# Patient Record
Sex: Female | Born: 1955 | Race: Black or African American | Hispanic: No | Marital: Single | Smoking: Former smoker
Health system: Southern US, Community
[De-identification: ages and names within clinical notes are randomized; demographics above are authoritative.]

## PROBLEM LIST (undated history)

## (undated) DIAGNOSIS — I10 Essential (primary) hypertension: Secondary | ICD-10-CM

## (undated) DIAGNOSIS — R7303 Prediabetes: Secondary | ICD-10-CM

## (undated) DIAGNOSIS — K219 Gastro-esophageal reflux disease without esophagitis: Secondary | ICD-10-CM

## (undated) DIAGNOSIS — Z8719 Personal history of other diseases of the digestive system: Secondary | ICD-10-CM

## (undated) DIAGNOSIS — D649 Anemia, unspecified: Secondary | ICD-10-CM

## (undated) DIAGNOSIS — R011 Cardiac murmur, unspecified: Secondary | ICD-10-CM

## (undated) HISTORY — PX: APPENDECTOMY: SHX54

## (undated) HISTORY — PX: ESOPHAGOGASTRODUODENOSCOPY: SHX1529

## (undated) HISTORY — PX: COLONOSCOPY: SHX174

---

## 2008-09-01 ENCOUNTER — Ambulatory Visit: Payer: Self-pay | Admitting: Family Medicine

## 2008-10-06 ENCOUNTER — Ambulatory Visit: Payer: Self-pay | Admitting: Gastroenterology

## 2009-05-18 ENCOUNTER — Ambulatory Visit: Payer: Self-pay | Admitting: Family Medicine

## 2014-10-08 ENCOUNTER — Encounter: Payer: Self-pay | Admitting: Medical Oncology

## 2014-10-08 ENCOUNTER — Emergency Department
Admission: EM | Admit: 2014-10-08 | Discharge: 2014-10-08 | Disposition: A | Discharge status: Home / Self Care | Payer: Self-pay | Attending: Emergency Medicine | Admitting: Emergency Medicine

## 2014-10-08 DIAGNOSIS — I10 Essential (primary) hypertension: Secondary | ICD-10-CM | POA: Insufficient documentation

## 2014-10-08 DIAGNOSIS — Y998 Other external cause status: Secondary | ICD-10-CM | POA: Insufficient documentation

## 2014-10-08 DIAGNOSIS — Y9389 Activity, other specified: Secondary | ICD-10-CM | POA: Insufficient documentation

## 2014-10-08 DIAGNOSIS — Y9289 Other specified places as the place of occurrence of the external cause: Secondary | ICD-10-CM | POA: Insufficient documentation

## 2014-10-08 DIAGNOSIS — S60561A Insect bite (nonvenomous) of right hand, initial encounter: Secondary | ICD-10-CM | POA: Insufficient documentation

## 2014-10-08 DIAGNOSIS — Z79899 Other long term (current) drug therapy: Secondary | ICD-10-CM | POA: Insufficient documentation

## 2014-10-08 DIAGNOSIS — Z760 Encounter for issue of repeat prescription: Secondary | ICD-10-CM | POA: Insufficient documentation

## 2014-10-08 DIAGNOSIS — W57XXXA Bitten or stung by nonvenomous insect and other nonvenomous arthropods, initial encounter: Secondary | ICD-10-CM | POA: Insufficient documentation

## 2014-10-08 HISTORY — DX: Essential (primary) hypertension: I10

## 2014-10-08 HISTORY — DX: Gastro-esophageal reflux disease without esophagitis: K21.9

## 2014-10-08 MED ORDER — CEPHALEXIN 500 MG PO CAPS: 500.0000 mg | ORAL_CAPSULE | Freq: Four times a day (QID) | ORAL | Status: AC

## 2014-10-08 MED ORDER — HYDROXYZINE PAMOATE 25 MG PO CAPS: 25.0000 mg | ORAL_CAPSULE | Freq: Three times a day (TID) | ORAL | Status: DC | PRN

## 2014-10-08 MED ORDER — LISINOPRIL 40 MG PO TABS: 40.0000 mg | ORAL_TABLET | Freq: Every day | ORAL | Status: DC

## 2014-10-08 NOTE — ED Notes (Signed)
Pt ambulatory to triage with possible insect bite to rt hand. Area swollen and itchy.

## 2014-10-08 NOTE — Discharge Instructions (Signed)
Insect Bite Mosquitoes, flies, fleas, bedbugs, and many other insects can bite. Insect bites are different from insect stings. A sting is when venom is injected into the skin. Some insect bites can transmit infectious diseases. SYMPTOMS  Insect bites usually turn red, swell, and itch for 2 to 4 days. They often go away on their own. TREATMENT  Your caregiver may prescribe antibiotic medicines if a bacterial infection develops in the bite. HOME CARE INSTRUCTIONS  Do not scratch the bite area.  Keep the bite area clean and dry. Wash the bite area thoroughly with soap and water.  Put ice or cool compresses on the bite area.  Put ice in a plastic bag.  Place a towel between your skin and the bag.  Leave the ice on for 20 minutes, 4 times a day for the first 2 to 3 days, or as directed.  You may apply a baking soda paste, cortisone cream, or calamine lotion to the bite area as directed by your caregiver. This can help reduce itching and swelling.  Only take over-the-counter or prescription medicines as directed by your caregiver.  In 2 days, if no improvement with Vistaril and hydrocortisone cream and start antibiotic You may need a tetanus shot if:  You cannot remember when you had your last tetanus shot.  You have never had a tetanus shot.  The injury broke your skin. If you get a tetanus shot, your arm may swell, get red, and feel warm to the touch. This is common and not a problem. If you need a tetanus shot and you choose not to have one, there is a rare chance of getting tetanus. Sickness from tetanus can be serious. SEEK IMMEDIATE MEDICAL CARE IF:   You have increased pain, redness, or swelling in the bite area.  You see a red line on the skin coming from the bite.  You have a fever.  You have joint pain.  You have a headache or neck pain.  You have unusual weakness.  You have a rash.  You have chest pain or shortness of breath.  You have abdominal pain, nausea,  or vomiting.  You feel unusually tired or sleepy. MAKE SURE YOU:   Understand these instructions.  Will watch your condition.  Will get help right away if you are not doing well or get worse. Document Released: 03/01/2004 Document Revised: 04/16/2011 Document Reviewed: 08/23/2010 [facility] Patient Information 2015 [place], [place]. This information is not intended to replace advice given to you by your health care provider. Make sure you discuss any questions you have with your health care provider.  Hypertension Hypertension is another name for high blood pressure. High blood pressure forces your heart to work harder to pump blood. A blood pressure reading has two numbers, which includes a higher number over a lower number (example: 110/72). HOME CARE   Have your blood pressure rechecked by your doctor.  Only take medicine as told by your doctor. Follow the directions carefully. The medicine does not work as well if you skip doses. Skipping doses also puts you at risk for problems.  Do not smoke.  Monitor your blood pressure at home as told by your doctor. GET HELP IF:  You think you are having a reaction to the medicine you are taking.  You have repeat headaches or feel dizzy.  You have puffiness (swelling) in your ankles.  You have trouble with your vision. GET HELP RIGHT AWAY IF:   You get a very bad headache  and are confused.  You feel weak, numb, or faint.  You get chest or belly (abdominal) pain.  You throw up (vomit).  You cannot breathe very well. MAKE SURE YOU:   Understand these instructions.  Will watch your condition.  Will get help right away if you are not doing well or get worse. Document Released: 07/11/2007 Document Revised: 01/27/2013 Document Reviewed: 11/14/2012 [facility] Patient Information 2015 [place], [place]. This information is not intended to replace advice given to you by your health care provider. Make sure you discuss any questions you  have with your health care provider.

## 2014-10-08 NOTE — ED Provider Notes (Signed)
CSN: [id number]     Arrival date & time 10/08/14  1541 History   First MD Initiated Contact with Patient 10/08/14 1617     Chief Complaint  Patient presents with  . Insect Bite     (Consider location/radiation/quality/duration/timing/severity/associated sxs/prior Treatment) HPI  59 year old female presents today for evaluation of erythema and pruritus to the right hand. Symptoms have been present for 1-2 days. She denies any trauma or injury. She is unsure what has caused the inflammation. She believes it may have been an insect bite. Patient began scratching the dorsum of the right hand, she has had increased swelling. No drainage or pain. There is mild warmth.  Patient has history of hypertension, she took her last tablet 40 mg of lisinopril this morning. Patient needs a refill until she can see her new PCP she denies any chest pain shortness of breath headaches or vision changes.  Past Medical History  Diagnosis Date  . Hypertension   . GERD (gastroesophageal reflux disease)    Past Surgical History  Procedure Laterality Date  . Appendectomy     No family history on file. Social History  Substance Use Topics  . Smoking status: Never Smoker   . Smokeless tobacco: None  . Alcohol Use: Yes   OB History    No data available     Review of Systems  Constitutional: Negative for fever, chills, activity change and fatigue.  HENT: Negative for congestion, sinus pressure and sore throat.   Eyes: Negative for visual disturbance.  Respiratory: Negative for cough, chest tightness and shortness of breath.   Cardiovascular: Negative for chest pain and leg swelling.  Gastrointestinal: Negative for nausea, vomiting, abdominal pain and diarrhea.  Genitourinary: Negative for dysuria.  Musculoskeletal: Negative for arthralgias and gait problem.  Skin: Positive for rash.  Neurological: Negative for weakness, numbness and headaches.  Hematological: Negative for adenopathy.   Psychiatric/Behavioral: Negative for behavioral problems, confusion and agitation.      Allergies  Review of patient's allergies indicates no known allergies.  Home Medications   Prior to Admission medications   Medication Sig Start Date End Date Taking? Authorizing Provider  cephALEXin (KEFLEX) 500 MG capsule Take 1 capsule (500 mg total) by mouth 4 (four) times daily. 10/08/14 10/18/14  Duanne Guess, PA-C  hydrOXYzine (VISTARIL) 25 MG capsule Take 1 capsule (25 mg total) by mouth 3 (three) times daily as needed for itching. 10/08/14   Duanne Guess, PA-C  lisinopril (PRINIVIL,ZESTRIL) 40 MG tablet Take 40 mg by mouth daily.    Historical Provider, MD  lisinopril (PRINIVIL,ZESTRIL) 40 MG tablet Take 1 tablet (40 mg total) by mouth daily. 10/08/14   Duanne Guess, PA-C   BP 180/98 mmHg  Pulse 60  Temp(Src) 98.4 F (36.9 C) (Oral)  Resp 18  Ht 5\' 2"  (1.575 m)  Wt 168 lb (76.204 kg)  BMI 30.72 kg/m2  SpO2 100% Physical Exam  Constitutional: She is oriented to person, place, and time. She appears well-developed and well-nourished. No distress.  HENT:  Head: Normocephalic and atraumatic.  Mouth/Throat: Oropharynx is clear and moist.  Eyes: EOM are normal. Pupils are equal, round, and reactive to light. Right eye exhibits no discharge. Left eye exhibits no discharge.  Neck: Normal range of motion. Neck supple.  Cardiovascular: Normal rate, regular rhythm and intact distal pulses.   Pulmonary/Chest: Effort normal and breath sounds normal. No respiratory distress. She exhibits no tenderness.  Abdominal: Soft. She exhibits no distension. There is no tenderness.  Musculoskeletal: Normal range of motion. She exhibits no edema.  Neurological: She is alert and oriented to person, place, and time. She has normal reflexes.  Skin: Skin is warm and dry.  Dorsum of the right hand has 2 small papules with surrounding erythema. There is minimal warmth and minimal swelling. No tenderness to  palpation. Patient is able to make a full fist. She is neuro rest intact in right upper extremity. No fluctuance or induration.  Psychiatric: She has a normal mood and affect. Her behavior is normal. Thought content normal.  Nursing note and vitals reviewed.   ED Course  Procedures (including critical care time) Labs Review Labs Reviewed - No data to display  Imaging Review No results found. I have personally reviewed and evaluated these images and lab results as part of my medical decision-making.   EKG Interpretation None      MDM   Final diagnoses:  Insect bite of right hand, initial encounter  Essential hypertension  Medication refill    59 year old female with right hand insect bite, pruritus, inflammation. She is started on hydrocortisone cream, Vistaril. In 2 days if no improvement or for any worsening erythema or swelling, would recommend starting Keflex. Patient was given a prescription for Keflex and directions on when to take. Patient was also given a refill of her lisinopril. She took her last pill this morning and has a follow-up with a new PCP. Turn to the ER for any worsening symptoms urgent changes    Duanne Guess, PA-C 10/08/14 1636  Earleen Newport, MD 10/08/14 2138

## 2015-08-24 ENCOUNTER — Encounter: Payer: Self-pay | Admitting: *Deleted

## 2015-08-24 ENCOUNTER — Ambulatory Visit: Payer: Self-pay | Attending: Internal Medicine | Admitting: *Deleted

## 2015-08-24 ENCOUNTER — Ambulatory Visit
Admission: RE | Admit: 2015-08-24 | Discharge: 2015-08-24 | Disposition: A | Discharge status: Home / Self Care | Payer: Self-pay | Source: Ambulatory Visit | Attending: Internal Medicine | Admitting: Internal Medicine

## 2015-08-24 VITALS — BP 163/94 | HR 71 | Temp 97.1°F | Resp 16 | Ht 64.57 in | Wt 163.8 lb

## 2015-08-24 DIAGNOSIS — Z0000 Encounter for general adult medical examination without abnormal findings: Secondary | ICD-10-CM

## 2015-08-24 NOTE — Progress Notes (Signed)
Subjective:     Patient ID: Cassie Gaines, female   DOB: [birth year], 60 y.o.   MRN: [id number]  HPI   Review of Systems     Objective:   Physical Exam  Pulmonary/Chest: Right breast exhibits no inverted nipple, no mass, no nipple discharge, no skin change and no tenderness. Left breast exhibits no inverted nipple, no mass, no nipple discharge, no skin change and no tenderness. Breasts are symmetrical.  Abdominal: There is no splenomegaly or hepatomegaly.  Genitourinary: No breast swelling, tenderness, discharge or bleeding. No labial fusion. There is no rash, tenderness, lesion or injury on the right labia. There is no rash, tenderness, lesion or injury on the left labia. Cervix exhibits no motion tenderness, no discharge and no friability. Right adnexum displays no mass, no tenderness and no fullness. Left adnexum displays no mass, no tenderness and no fullness. No erythema, tenderness or bleeding in the vagina. No foreign body around the vagina. No signs of injury around the vagina. Vaginal discharge found.  White non odorous discharge noted.  Very full heavy cervix noted       Assessment:     60 year old Black female presents to [facility] for clinical breast exam, pap and mammogram.  Clinical breast exam unremarkable.  Taught self breast awareness.  Specimen collected for pap smear without difficulty.  Very full heavy cervix noted.  Patient states she was told she had fibroids.  Patient has been screened for eligibility.  She does not have any insurance, Medicare or Medicaid.  She also meets financial eligibility.  Hand-out given on the Affordable Care Act.    Plan:     Screening mammogram ordered.  Specimen sent to the lab.  Will send patient to [facility] for further evaluation of the full heavy cervix.  [name] to call and schedule the appointment for her.  Will follow up per [facility] protocol.

## 2015-08-24 NOTE — Patient Instructions (Signed)
Human Papillomavirus Human papillomavirus (HPV) is the most common sexually transmitted infection (STI) and is highly contagious. HPV infections cause genital warts and cancers to the outlet of the womb (cervix), birth canal (vagina), opening of the birth canal (vulva), and anus. There are over 100 types of HPV. Unless wartlike lesions are present in the throat or there are genital warts that you can see or feel, HPV usually does not cause symptoms. It is possible to be infected for long periods and pass it on to others without knowing it. CAUSES  HPV is spread from person to person through sexual contact. This includes oral, vaginal, or anal sex. RISK FACTORS  Having unprotected sex. HPV can be spread by oral, vaginal, or anal sex.  Having several sex partners.  Having a sex partner who has other sex partners.  Having or having had another sexually transmitted infection. SIGNS AND SYMPTOMS  Most people carrying HPV do not have any symptoms. If symptoms are present, symptoms may include:  Wartlike lesions in the throat (from having oral sex).  Warts in the infected skin or mucous membranes.  Genital warts that may itch, burn, or bleed.  Genital warts that may be painful or bleed during sexual intercourse. DIAGNOSIS  If wartlike lesions are present in the throat or genital warts are present, your health care provider can usually diagnose HPV by physical examination.   Genital warts are easily seen with the naked eye.  Currently, there is no FDA-approved test to detect HPV in males.  In females, a Pap test can show cells that are infected with HPV.  In females, a scope can be used to view the cervix (colposcopy). A colposcopy can be performed if the pelvic exam or Pap test is abnormal. A sample of tissue may be removed (biopsy) during the colposcopy. TREATMENT  There is no treatment for the virus itself. However, there are treatments for the health problems and symptoms HPV can cause.  Your health care provider will follow you closely after you are treated. This is because the HPV can come back and may need treatment again. Treatment of HPV may include:   Medicines, which may be injected or applied in a cream, lotion, or gel form.  Use of a probe to apply extreme cold (cryotherapy).  Application of an intense beam of light (laser treatment).  Use of a probe to apply extreme heat (electrocautery).  Surgery. HOME CARE INSTRUCTIONS   Take medicines only as directed by your health care provider.  Use over-the-counter creams for itching or irritation as directed by your health care provider.  Keep all follow-up visits as directed by your health care provider. This is important.  Do not touch or scratch the warts.  Do not treat genital warts with medicines used for treating hand warts.  Do not have sex while you are being treated.  Do not douche or use tampons during treatment of HPV.  Tell your sex partner about your infection because he or she may also need treatment.  If you become pregnant, tell your health care provider that you have had HPV. Your health care provider will monitor you closely during pregnancy to be sure your baby is safe.  After treatment, use condoms during sex to prevent future infections.  Have only one sex partner.  Have a sex partner who does not have other sex partners. PREVENTION   Talk to your health care provider about getting the HPV vaccines. These vaccines prevent some HPV infections and cancers.  It is recommended that the vaccine be given to males and females between the ages of 2 and 72 years old. It will not work if you already have HPV, and it is not recommended for pregnant women.  A Pap test is done to screen for cervical cancer in women.  The first Pap test should be done at age 69 years.  Between ages 62 and 16 years, Pap tests are repeated every 2 years.  Beginning at age 41, you are advised to have a Pap test every  3 years as long as your past 3 Pap tests have been normal.  Some women have medical problems that increase the chance of getting cervical cancer. Talk to your health care provider about these problems. It is especially important to talk to your health care provider if a new problem develops soon after your last Pap test. In these cases, your health care provider may recommend more frequent screening and Pap tests.  The above recommendations are the same for women who have or have not gotten the vaccine for HPV.  If you had a hysterectomy for a problem that was not a cancer or a condition that could lead to cancer, then you no longer need Pap tests. However, even if you no longer need a Pap test, a regular exam is a good idea to make sure no other problems are starting.   If you are between the ages of 60 and 39 years and you have had normal Pap tests going back 10 years, you no longer need Pap tests. However, even if you no longer need a Pap test, a regular exam is a good idea to make sure no other problems are starting.  If you have had past treatment for cervical cancer or a condition that could lead to cancer, you need Pap tests and screening for cancer for at least 20 years after your treatment.  If Pap tests have been discontinued, risk factors (such as a new sexual partner)need to be reassessed to determine if screening should be resumed.  Some women may need screenings more often if they are at high risk for cervical cancer. SEEK MEDICAL CARE IF:   The treated skin becomes red, swollen, or painful.  You have a fever.  You feel generally ill.  You feel lumps or pimple-like projections in and around your genital area.  You develop bleeding of the vagina or the treatment area.  You have painful sexual intercourse. MAKE SURE YOU:   Understand these instructions.  Will watch your condition.  Will get help if you are not doing well or get worse.   This information is not  intended to replace advice given to you by your health care provider. Make sure you discuss any questions you have with your health care provider.   Document Released: 04/14/2003 Document Revised: 02/12/2014 Document Reviewed: 04/29/2013 Elsevier Interactive Patient Education 2016 [place] patient hand-out, Women Staying Healthy, Active and Well from [place], with education on breast health, pap smears, heart and colon health.

## 2015-08-25 LAB — PAP LB AND HPV HIGH-RISK
HPV, HIGH-RISK: NEGATIVE
PAP Smear Comment: 0

## 2015-09-19 ENCOUNTER — Encounter: Payer: Self-pay | Admitting: *Deleted

## 2015-09-19 NOTE — Progress Notes (Signed)
Letter mailed from [facility] to inform patient of her normal mammogram results.  Patient is to follow-up with annual screening in one year.  HSIS to Christy. 

## 2015-09-27 ENCOUNTER — Encounter: Payer: Self-pay | Admitting: Obstetrics and Gynecology

## 2017-01-14 ENCOUNTER — Ambulatory Visit: Payer: Self-pay | Attending: Oncology

## 2019-02-25 ENCOUNTER — Ambulatory Visit: Payer: Self-pay | Admitting: Surgery

## 2019-02-25 NOTE — H&P (View-Only) (Signed)
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 female who was referred by Kathline Magic, MD for evaluation of above. Noted on screening colonoscopy.  Asymptomatic.  Unable to be resected completely so here today to discuss surgical excision.  Past Medical History:  has a past medical history of HTN (hypertension).  Past Surgical History:  has a past surgical history that includes Appendectomy; Colonoscopy; Colonoscopy (12/08/2018); egd (12/08/2018); and Colonoscopy (01/27/2019).  Family History: family history includes Colon cancer in her paternal grandfather; Liver disease in her brother.  Social History:  reports that she has never smoked. She has never used smokeless tobacco. No history on file for alcohol and drug.  Current Medications: has a current medication list which includes the following prescription(s): losartan, omeprazole, metronidazole, and neomycin.  Allergies:       Allergies  Allergen Reactions  . Lisinopril Cough    Cough?    ROS:  A 15 point review of systems was performed and pertinent positives and negatives noted in HPI   Objective:   BP (!) 177/94   Pulse 100   Ht 157.5 cm (5\' 2" )   Wt 77.6 kg (171 lb)   BMI 31.28 kg/m   Constitutional :  alert, appears stated age, cooperative and no distress  Lymphatics/Throat:  no asymmetry, masses, or scars  Respiratory:  clear to auscultation bilaterally  Cardiovascular:  regular rate and rhythm  Gastrointestinal: soft, non-tender; bowel sounds normal; no masses,  no organomegaly.    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 The risks of general anesthetic, if used, includes MI, CVA, sudden death or even reaction to anesthetic medications also discussed. Alternatives include continued observation.Benefits include possible symptom relief, preventing further decline in health and possible death.  Typical post-op recovery time of additional days in hospital for observation afterwards also discussed.  Prep ordered. Will proceed with ERAS protocol as well. Pending medical clearance and workup as noted above.   The patientverbalized understanding and all questions were answered to the patient's satisfaction.

## 2019-02-25 NOTE — H&P (Signed)
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 female who was referred by Kathline Magic, MD for evaluation of above. Noted on screening colonoscopy.  Asymptomatic.  Unable to be resected completely so here today to discuss surgical excision.  Past Medical History:  has a past medical history of HTN (hypertension).  Past Surgical History:  has a past surgical history that includes Appendectomy; Colonoscopy; Colonoscopy (12/08/2018); egd (12/08/2018); and Colonoscopy (01/27/2019).  Family History: family history includes Colon cancer in her paternal grandfather; Liver disease in her brother.  Social History:  reports that she has never smoked. She has never used smokeless tobacco. No history on file for alcohol and drug.  Current Medications: has a current medication list which includes the following prescription(s): losartan, omeprazole, metronidazole, and neomycin.  Allergies:       Allergies  Allergen Reactions  . Lisinopril Cough    Cough?    ROS:  A 15 point review of systems was performed and pertinent positives and negatives noted in HPI   Objective:   BP (!) 177/94   Pulse 100   Ht 157.5 cm (5\' 2" )   Wt 77.6 kg (171 lb)   BMI 31.28 kg/m   Constitutional :  alert, appears stated age, cooperative and no distress  Lymphatics/Throat:  no asymmetry, masses, or scars  Respiratory:  clear to auscultation bilaterally  Cardiovascular:  regular rate and rhythm  Gastrointestinal: soft, non-tender; bowel sounds normal; no masses,  no organomegaly.    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 The risks of general anesthetic, if used, includes MI, CVA, sudden death or even reaction to anesthetic medications also discussed. Alternatives include continued observation.Benefits include possible symptom relief, preventing further decline in health and possible death.  Typical post-op recovery time of additional days in hospital for observation afterwards also discussed.  Prep ordered. Will proceed with ERAS protocol as well. Pending medical clearance and workup as noted above.   The patientverbalized understanding and all questions were answered to the patient's satisfaction.

## 2019-03-05 ENCOUNTER — Encounter
Admission: RE | Admit: 2019-03-05 | Discharge: 2019-03-05 | Disposition: A | Discharge status: Home / Self Care | Payer: 59 | Source: Ambulatory Visit | Attending: Surgery | Admitting: Surgery

## 2019-03-05 DIAGNOSIS — D126 Benign neoplasm of colon, unspecified: Secondary | ICD-10-CM | POA: Insufficient documentation

## 2019-03-05 DIAGNOSIS — Z20822 Contact with and (suspected) exposure to covid-19: Secondary | ICD-10-CM | POA: Insufficient documentation

## 2019-03-05 DIAGNOSIS — Z01818 Encounter for other preprocedural examination: Secondary | ICD-10-CM | POA: Insufficient documentation

## 2019-03-05 HISTORY — DX: Cardiac murmur, unspecified: R01.1

## 2019-03-05 HISTORY — DX: Personal history of other diseases of the digestive system: Z87.19

## 2019-03-05 HISTORY — DX: Prediabetes: R73.03

## 2019-03-05 HISTORY — DX: Anemia, unspecified: D64.9

## 2019-03-05 NOTE — Patient Instructions (Signed)
Your procedure is scheduled on: 03-10-19 TUESDAY Report to Same Day Surgery 2nd floor medical mall [facility] Entrance-take elevator on left to 2nd floor.  Check in with surgery information desk.) To find out your arrival time please call [phone] between 1PM - 3PM on  03-09-19 MONDAY  Remember: Instructions that are not followed completely may result in serious medical risk, up to and including death, or upon the discretion of your surgeon and anesthesiologist your surgery may need to be rescheduled.    _x___ 1. Do not eat food after midnight the night before your procedure. NO GUM OR CANDY AFTER MIDNIGHT. You may drink clear liquids up to 2 hours before you are scheduled to arrive at the hospital for your procedure.  Do not drink clear liquids within 2 hours of your scheduled arrival to the hospital.  Clear liquids include  --Water or Apple juice without pulp  --Gatorade  --Black Coffee or Clear Tea (No milk, no creamers, do not add anything to the coffee or Tea   ____Ensure clear carbohydrate drink on the way to the hospital for bariatric patients  ____Ensure clear carbohydrate drink 3 hours before surgery.     __x__ 2. No Alcohol for 24 hours before or after surgery.   __x__3. No Smoking or e-cigarettes for 24 prior to surgery.  Do not use any chewable tobacco products for at least 6 hour prior to surgery   ____  4. Bring all medications with you on the day of surgery if instructed.    __x__ 5. Notify your doctor if there is any change in your medical condition     (cold, fever, infections).    x___6. On the morning of surgery brush your teeth with toothpaste and water.  You may rinse your mouth with mouth wash if you wish.  Do not swallow any toothpaste or mouthwash.   Do not wear jewelry, make-up, hairpins, clips or nail polish.  Do not wear lotions, powders, or perfumes.   Do not shave 48 hours prior to surgery. Men may shave face and neck.  Do not bring valuables to the  hospital.    [facility] is not responsible for any belongings or valuables.               Contacts, dentures or bridgework may not be worn into surgery.  Leave your suitcase in the car. After surgery it may be brought to your room.  For patients admitted to the hospital, discharge time is determined by your treatment team.  _  Patients discharged the day of surgery will not be allowed to drive home.  You will need someone to drive you home and stay with you the night of your procedure.    Please read over the following fact sheets that you were given:   [facility] Preparing for Surgery and or MRSA Information   _x___ TAKE THE FOLLOWING MEDICATION THE MORNING OF SURGERY WITH A SMALL SIP OF WATER. These include:  1. PRILOSEC (OMEPRAZOLE)  2. TAKE A PRILOSEC THE NIGHT BEFORE YOUR SURGERY  3.  4.  5.  6.  ____Fleets enema or Magnesium Citrate as directed.   _x___ Use CHG Soap or sage wipes as directed on instruction sheet   ____ Use inhalers on the day of surgery and bring to hospital day of surgery  ____ Stop Metformin and Janumet 2 days prior to surgery.    ____ Take 1/2 of usual insulin dose the night before surgery and none on  the morning surgery.   ____ Follow recommendations from Cardiologist, Pulmonologist or PCP regarding stopping Aspirin, Coumadin, Plavix ,Eliquis, Effient, or Pradaxa, and Pletal.  X____Stop Anti-inflammatories such as Advil, Aleve, Ibuprofen, Motrin, Naproxen, Naprosyn, Goodies powders or aspirin products NOW-OK to take Tylenol    ____ Stop supplements until after surgery.     ____ Bring C-Pap to the hospital.

## 2019-03-06 ENCOUNTER — Encounter
Admission: RE | Admit: 2019-03-06 | Discharge: 2019-03-06 | Disposition: A | Discharge status: Home / Self Care | Payer: 59 | Source: Ambulatory Visit | Attending: Surgery | Admitting: Surgery

## 2019-03-06 ENCOUNTER — Other Ambulatory Visit: Admission: RE | Admit: 2019-03-06 | Payer: 59 | Source: Ambulatory Visit

## 2019-03-06 ENCOUNTER — Other Ambulatory Visit: Payer: Self-pay

## 2019-03-06 DIAGNOSIS — Z01818 Encounter for other preprocedural examination: Secondary | ICD-10-CM | POA: Diagnosis not present

## 2019-03-06 LAB — SARS CORONAVIRUS 2 (TAT 6-24 HRS): SARS Coronavirus 2: NEGATIVE

## 2019-03-09 MED ORDER — SODIUM CHLORIDE 0.9 % IV SOLN
2.0000 g | INTRAVENOUS | Status: AC
  Administered 2019-03-10: 2 g via INTRAVENOUS
  Filled 2019-03-09: qty 2

## 2019-03-09 MED ORDER — INDOCYANINE GREEN 25 MG IV SOLR
7.5000 mg | Freq: Once | INTRAVENOUS | Status: AC
  Administered 2019-03-10: 2.5 mg via INTRAVENOUS
  Filled 2019-03-09: qty 25

## 2019-03-10 ENCOUNTER — Inpatient Hospital Stay: Payer: No Typology Code available for payment source | Admitting: Anesthesiology

## 2019-03-10 ENCOUNTER — Inpatient Hospital Stay
Admission: RE | Admit: 2019-03-10 | Discharge: 2019-03-31 | DRG: 330 | Disposition: A | Discharge status: Home / Self Care | Payer: No Typology Code available for payment source | Attending: Surgery | Admitting: Surgery

## 2019-03-10 ENCOUNTER — Other Ambulatory Visit: Payer: Self-pay

## 2019-03-10 ENCOUNTER — Encounter
Admission: RE | Disposition: A | Discharge status: Home / Self Care | Payer: Self-pay | Source: Home / Self Care | Attending: Surgery

## 2019-03-10 ENCOUNTER — Encounter: Payer: Self-pay | Admitting: Surgery

## 2019-03-10 DIAGNOSIS — K9189 Other postprocedural complications and disorders of digestive system: Secondary | ICD-10-CM | POA: Diagnosis not present

## 2019-03-10 DIAGNOSIS — K567 Ileus, unspecified: Secondary | ICD-10-CM | POA: Diagnosis not present

## 2019-03-10 DIAGNOSIS — K635 Polyp of colon: Secondary | ICD-10-CM | POA: Diagnosis present

## 2019-03-10 DIAGNOSIS — Z978 Presence of other specified devices: Secondary | ICD-10-CM

## 2019-03-10 DIAGNOSIS — Z888 Allergy status to other drugs, medicaments and biological substances status: Secondary | ICD-10-CM | POA: Diagnosis not present

## 2019-03-10 DIAGNOSIS — K219 Gastro-esophageal reflux disease without esophagitis: Secondary | ICD-10-CM | POA: Diagnosis present

## 2019-03-10 DIAGNOSIS — I10 Essential (primary) hypertension: Secondary | ICD-10-CM | POA: Diagnosis present

## 2019-03-10 DIAGNOSIS — D126 Benign neoplasm of colon, unspecified: Secondary | ICD-10-CM | POA: Diagnosis present

## 2019-03-10 DIAGNOSIS — Z800 Family history of malignant neoplasm of digestive organs: Secondary | ICD-10-CM | POA: Diagnosis not present

## 2019-03-10 DIAGNOSIS — R140 Abdominal distension (gaseous): Secondary | ICD-10-CM

## 2019-03-10 LAB — GLUCOSE, CAPILLARY
Glucose-Capillary: 87 mg/dL (ref 70–99)
Glucose-Capillary: 112 mg/dL — ABNORMAL HIGH (ref 70–99)

## 2019-03-10 SURGERY — COLECTOMY, PARTIAL, ROBOT-ASSISTED, LAPAROSCOPIC
Anesthesia: General | Site: Abdomen | Laterality: Right

## 2019-03-10 MED ORDER — LIDOCAINE HCL (CARDIAC) PF 100 MG/5ML IV SOSY
PREFILLED_SYRINGE | INTRAVENOUS | Status: DC | PRN
  Administered 2019-03-10: 80 mg via INTRAVENOUS

## 2019-03-10 MED ORDER — CHLORHEXIDINE GLUCONATE CLOTH 2 % EX PADS
6.0000 | MEDICATED_PAD | Freq: Every day | CUTANEOUS | Status: DC
  Administered 2019-03-11: 09:00:00 6 via TOPICAL

## 2019-03-10 MED ORDER — PROMETHAZINE HCL 25 MG/ML IJ SOLN: 6.2500 mg | INTRAMUSCULAR | Status: DC | PRN

## 2019-03-10 MED ORDER — PHENYLEPHRINE HCL (PRESSORS) 10 MG/ML IV SOLN
INTRAVENOUS | Status: DC | PRN
  Administered 2019-03-10: 100 ug via INTRAVENOUS

## 2019-03-10 MED ORDER — GABAPENTIN 300 MG PO CAPS
300.0000 mg | ORAL_CAPSULE | Freq: Two times a day (BID) | ORAL | Status: DC
  Administered 2019-03-10 – 2019-03-19 (×17): 300 mg via ORAL
  Filled 2019-03-10 (×17): qty 1

## 2019-03-10 MED ORDER — FENTANYL CITRATE (PF) 100 MCG/2ML IJ SOLN
INTRAMUSCULAR | Status: DC | PRN
  Administered 2019-03-10: 50 ug via INTRAVENOUS
  Administered 2019-03-10: 25 ug via INTRAVENOUS
  Administered 2019-03-10: 50 ug via INTRAVENOUS

## 2019-03-10 MED ORDER — ROCURONIUM BROMIDE 100 MG/10ML IV SOLN
INTRAVENOUS | Status: DC | PRN
  Administered 2019-03-10 (×2): 30 mg via INTRAVENOUS
  Administered 2019-03-10: 50 mg via INTRAVENOUS
  Administered 2019-03-10: 20 mg via INTRAVENOUS

## 2019-03-10 MED ORDER — BUPIVACAINE LIPOSOME 1.3 % IJ SUSP
INTRAMUSCULAR | Status: AC
  Filled 2019-03-10: qty 20

## 2019-03-10 MED ORDER — PHENYLEPHRINE HCL (PRESSORS) 10 MG/ML IV SOLN
INTRAVENOUS | Status: AC
  Filled 2019-03-10: qty 1

## 2019-03-10 MED ORDER — ONDANSETRON HCL 4 MG/2ML IJ SOLN
INTRAMUSCULAR | Status: DC | PRN
  Administered 2019-03-10: 4 mg via INTRAVENOUS

## 2019-03-10 MED ORDER — FENTANYL CITRATE (PF) 100 MCG/2ML IJ SOLN
INTRAMUSCULAR | Status: AC
  Administered 2019-03-10: 20:00:00 25 ug via INTRAVENOUS
  Filled 2019-03-10: qty 2

## 2019-03-10 MED ORDER — MIDAZOLAM HCL 2 MG/2ML IJ SOLN
INTRAMUSCULAR | Status: AC
  Filled 2019-03-10: qty 2

## 2019-03-10 MED ORDER — FENTANYL CITRATE (PF) 100 MCG/2ML IJ SOLN
INTRAMUSCULAR | Status: AC
  Filled 2019-03-10: qty 2

## 2019-03-10 MED ORDER — FAMOTIDINE 20 MG PO TABS: 20.0000 mg | ORAL_TABLET | Freq: Once | ORAL | Status: DC

## 2019-03-10 MED ORDER — GLYCOPYRROLATE 0.2 MG/ML IJ SOLN
INTRAMUSCULAR | Status: DC | PRN
  Administered 2019-03-10: .1 mg via INTRAVENOUS

## 2019-03-10 MED ORDER — ONDANSETRON HCL 4 MG/2ML IJ SOLN
INTRAMUSCULAR | Status: AC
  Filled 2019-03-10: qty 2

## 2019-03-10 MED ORDER — ACETAMINOPHEN 10 MG/ML IV SOLN
INTRAVENOUS | Status: AC
  Filled 2019-03-10: qty 100

## 2019-03-10 MED ORDER — ENOXAPARIN SODIUM 40 MG/0.4ML ~~LOC~~ SOLN
40.0000 mg | SUBCUTANEOUS | Status: DC
  Administered 2019-03-11 – 2019-03-16 (×6): 40 mg via SUBCUTANEOUS
  Filled 2019-03-10 (×6): qty 0.4

## 2019-03-10 MED ORDER — OXYCODONE HCL 5 MG/5ML PO SOLN: 5.0000 mg | Freq: Once | ORAL | Status: DC | PRN

## 2019-03-10 MED ORDER — SEVOFLURANE IN SOLN
RESPIRATORY_TRACT | Status: AC
  Filled 2019-03-10: qty 250

## 2019-03-10 MED ORDER — HYDROCODONE-ACETAMINOPHEN 5-325 MG PO TABS
1.0000 | ORAL_TABLET | ORAL | Status: DC | PRN
  Administered 2019-03-10 – 2019-03-11 (×2): 2 via ORAL
  Filled 2019-03-10 (×2): qty 2

## 2019-03-10 MED ORDER — OXYCODONE HCL 5 MG PO TABS: 5.0000 mg | ORAL_TABLET | Freq: Once | ORAL | Status: DC | PRN

## 2019-03-10 MED ORDER — GABAPENTIN 300 MG PO CAPS: 300.0000 mg | ORAL_CAPSULE | ORAL | Status: AC

## 2019-03-10 MED ORDER — BUPIVACAINE HCL (PF) 0.5 % IJ SOLN
INTRAMUSCULAR | Status: AC
  Filled 2019-03-10: qty 30

## 2019-03-10 MED ORDER — CELECOXIB 200 MG PO CAPS
ORAL_CAPSULE | ORAL | Status: AC
  Administered 2019-03-10: 10:00:00 200 mg via ORAL
  Filled 2019-03-10: qty 1

## 2019-03-10 MED ORDER — EPHEDRINE SULFATE 50 MG/ML IJ SOLN
INTRAMUSCULAR | Status: DC | PRN
  Administered 2019-03-10 (×2): 5 mg via INTRAVENOUS
  Administered 2019-03-10: 10 mg via INTRAVENOUS
  Administered 2019-03-10: 5 mg via INTRAVENOUS

## 2019-03-10 MED ORDER — HYDROMORPHONE HCL 1 MG/ML IJ SOLN
0.5000 mg | INTRAMUSCULAR | Status: DC | PRN
  Administered 2019-03-11 – 2019-03-12 (×2): 0.5 mg via INTRAVENOUS
  Filled 2019-03-10 (×2): qty 0.5

## 2019-03-10 MED ORDER — LACTATED RINGERS IV SOLN: INTRAVENOUS | Status: DC

## 2019-03-10 MED ORDER — ONDANSETRON HCL 4 MG/2ML IJ SOLN
4.0000 mg | Freq: Four times a day (QID) | INTRAMUSCULAR | Status: DC | PRN
  Administered 2019-03-12 – 2019-03-17 (×4): 4 mg via INTRAVENOUS
  Filled 2019-03-10 (×4): qty 2

## 2019-03-10 MED ORDER — LIDOCAINE HCL (PF) 2 % IJ SOLN
INTRAMUSCULAR | Status: AC
  Filled 2019-03-10: qty 10

## 2019-03-10 MED ORDER — GLYCOPYRROLATE 0.2 MG/ML IJ SOLN
INTRAMUSCULAR | Status: AC
  Filled 2019-03-10: qty 1

## 2019-03-10 MED ORDER — SODIUM CHLORIDE FLUSH 0.9 % IV SOLN
INTRAVENOUS | Status: AC
  Filled 2019-03-10: qty 40

## 2019-03-10 MED ORDER — ACETAMINOPHEN 500 MG PO TABS: 1000.0000 mg | ORAL_TABLET | ORAL | Status: AC

## 2019-03-10 MED ORDER — ROCURONIUM BROMIDE 50 MG/5ML IV SOLN
INTRAVENOUS | Status: AC
  Filled 2019-03-10: qty 2

## 2019-03-10 MED ORDER — SUGAMMADEX SODIUM 200 MG/2ML IV SOLN
INTRAVENOUS | Status: DC | PRN
  Administered 2019-03-10: 160 mg via INTRAVENOUS

## 2019-03-10 MED ORDER — CELECOXIB 200 MG PO CAPS: 200.0000 mg | ORAL_CAPSULE | ORAL | Status: AC

## 2019-03-10 MED ORDER — LOSARTAN POTASSIUM 50 MG PO TABS
50.0000 mg | ORAL_TABLET | Freq: Two times a day (BID) | ORAL | Status: DC
  Administered 2019-03-10 – 2019-03-18 (×15): 50 mg via ORAL
  Filled 2019-03-10 (×15): qty 1

## 2019-03-10 MED ORDER — FAMOTIDINE 20 MG PO TABS
ORAL_TABLET | ORAL | Status: AC
  Filled 2019-03-10: qty 1

## 2019-03-10 MED ORDER — DEXAMETHASONE SODIUM PHOSPHATE 10 MG/ML IJ SOLN
INTRAMUSCULAR | Status: AC
  Filled 2019-03-10: qty 1

## 2019-03-10 MED ORDER — VASOPRESSIN 20 UNIT/ML IV SOLN
INTRAVENOUS | Status: AC
  Filled 2019-03-10: qty 1

## 2019-03-10 MED ORDER — SODIUM CHLORIDE (PF) 0.9 % IJ SOLN
INTRAMUSCULAR | Status: AC
  Filled 2019-03-10: qty 50

## 2019-03-10 MED ORDER — CHLORHEXIDINE GLUCONATE CLOTH 2 % EX PADS
6.0000 | MEDICATED_PAD | Freq: Once | CUTANEOUS | Status: AC
  Administered 2019-03-10: 10:00:00 6 via TOPICAL

## 2019-03-10 MED ORDER — PROPOFOL 10 MG/ML IV BOLUS
INTRAVENOUS | Status: AC
  Filled 2019-03-10: qty 20

## 2019-03-10 MED ORDER — SODIUM CHLORIDE 0.9 % IV SOLN
INTRAVENOUS | Status: DC | PRN
  Administered 2019-03-10: 70 mL

## 2019-03-10 MED ORDER — FENTANYL CITRATE (PF) 100 MCG/2ML IJ SOLN
25.0000 ug | INTRAMUSCULAR | Status: DC | PRN
  Administered 2019-03-10 (×3): 25 ug via INTRAVENOUS

## 2019-03-10 MED ORDER — TRAMADOL HCL 50 MG PO TABS
50.0000 mg | ORAL_TABLET | Freq: Four times a day (QID) | ORAL | Status: DC | PRN
  Administered 2019-03-11: 20:00:00 50 mg via ORAL
  Filled 2019-03-10: qty 1

## 2019-03-10 MED ORDER — GABAPENTIN 300 MG PO CAPS
ORAL_CAPSULE | ORAL | Status: AC
  Administered 2019-03-10: 10:00:00 300 mg via ORAL
  Filled 2019-03-10: qty 1

## 2019-03-10 MED ORDER — DEXAMETHASONE SODIUM PHOSPHATE 10 MG/ML IJ SOLN
INTRAMUSCULAR | Status: DC | PRN
  Administered 2019-03-10: 10 mg via INTRAVENOUS

## 2019-03-10 MED ORDER — CELECOXIB 200 MG PO CAPS
200.0000 mg | ORAL_CAPSULE | Freq: Two times a day (BID) | ORAL | Status: DC
  Administered 2019-03-10 – 2019-03-19 (×18): 200 mg via ORAL
  Filled 2019-03-10 (×20): qty 1

## 2019-03-10 MED ORDER — SUGAMMADEX SODIUM 200 MG/2ML IV SOLN
INTRAVENOUS | Status: AC
  Filled 2019-03-10: qty 2

## 2019-03-10 MED ORDER — MIDAZOLAM HCL 2 MG/2ML IJ SOLN
INTRAMUSCULAR | Status: DC | PRN
  Administered 2019-03-10: 2 mg via INTRAVENOUS

## 2019-03-10 MED ORDER — ACETAMINOPHEN 10 MG/ML IV SOLN
INTRAVENOUS | Status: DC | PRN
  Administered 2019-03-10: 1000 mg via INTRAVENOUS

## 2019-03-10 MED ORDER — ACETAMINOPHEN 500 MG PO TABS
ORAL_TABLET | ORAL | Status: AC
  Administered 2019-03-10: 10:00:00 1000 mg via ORAL
  Filled 2019-03-10: qty 2

## 2019-03-10 MED ORDER — PROPOFOL 10 MG/ML IV BOLUS
INTRAVENOUS | Status: DC | PRN
  Administered 2019-03-10: 130 mg via INTRAVENOUS

## 2019-03-10 MED ORDER — BUPIVACAINE HCL (PF) 0.5 % IJ SOLN
INTRAMUSCULAR | Status: DC | PRN
  Administered 2019-03-10: 30 mL

## 2019-03-10 MED ORDER — MEPERIDINE HCL 50 MG/ML IJ SOLN: 6.2500 mg | INTRAMUSCULAR | Status: DC | PRN

## 2019-03-10 MED ORDER — ONDANSETRON 4 MG PO TBDP: 4.0000 mg | ORAL_TABLET | Freq: Four times a day (QID) | ORAL | Status: DC | PRN

## 2019-03-10 MED ORDER — ACETAMINOPHEN 325 MG PO TABS: 650.0000 mg | ORAL_TABLET | Freq: Four times a day (QID) | ORAL | Status: DC | PRN

## 2019-03-10 SURGICAL SUPPLY — 104 items
BAG INFUSER PRESSURE 100CC (MISCELLANEOUS) ×3 IMPLANT
BLADE CLIPPER SURG (BLADE) ×3 IMPLANT
BLADE SURG SZ10 CARB STEEL (BLADE) ×3 IMPLANT
BLADE SURG SZ11 CARB STEEL (BLADE) ×3 IMPLANT
BULB RESERV EVAC DRAIN JP 100C (MISCELLANEOUS) ×3 IMPLANT
CANISTER SUCT 1200ML W/VALVE (MISCELLANEOUS) ×3 IMPLANT
CANNULA REDUC XI 12-8 STAPL (CANNULA) ×1
CANNULA REDUC XI 12-8MM STAPL (CANNULA) ×1
CANNULA REDUCER 12-8 DVNC XI (CANNULA) ×1 IMPLANT
CHLORAPREP W/TINT 26 (MISCELLANEOUS) ×3 IMPLANT
COVER TIP SHEARS 8 DVNC (MISCELLANEOUS) ×1 IMPLANT
COVER TIP SHEARS 8MM DA VINCI (MISCELLANEOUS) ×2
COVER WAND RF STERILE (DRAPES) IMPLANT
DEFOGGER SCOPE WARMER CLEARIFY (MISCELLANEOUS) ×3 IMPLANT
DERMABOND ADVANCED (GAUZE/BANDAGES/DRESSINGS) ×2
DERMABOND ADVANCED .7 DNX12 (GAUZE/BANDAGES/DRESSINGS) ×1 IMPLANT
DRAIN CHANNEL JP 15F RND 16 (MISCELLANEOUS) ×3 IMPLANT
DRAPE 3/4 80X56 (DRAPES) ×3 IMPLANT
DRAPE ARM DVNC X/XI (DISPOSABLE) ×4 IMPLANT
DRAPE COLUMN DVNC XI (DISPOSABLE) ×1 IMPLANT
DRAPE DA VINCI XI ARM (DISPOSABLE) ×8
DRAPE DA VINCI XI COLUMN (DISPOSABLE) ×2
DRSG OPSITE POSTOP 4X10 (GAUZE/BANDAGES/DRESSINGS) IMPLANT
DRSG OPSITE POSTOP 4X8 (GAUZE/BANDAGES/DRESSINGS) IMPLANT
ELECT BLADE 6.5 EXT (BLADE) IMPLANT
ELECT CAUTERY BLADE 6.4 (BLADE) IMPLANT
ELECT REM PT RETURN 9FT ADLT (ELECTROSURGICAL) ×3
ELECTRODE REM PT RTRN 9FT ADLT (ELECTROSURGICAL) ×1 IMPLANT
GLOVE BIOGEL PI IND STRL 7.0 (GLOVE) ×3 IMPLANT
GLOVE BIOGEL PI INDICATOR 7.0 (GLOVE) ×6
GLOVE SURG SYN 6.5 ES PF (GLOVE) ×9 IMPLANT
GOWN STRL REUS W/ TWL LRG LVL3 (GOWN DISPOSABLE) ×6 IMPLANT
GOWN STRL REUS W/TWL LRG LVL3 (GOWN DISPOSABLE) ×12
GRASPER SUT TROCAR 14GX15 (MISCELLANEOUS) IMPLANT
HANDLE YANKAUER SUCT BULB TIP (MISCELLANEOUS) ×3 IMPLANT
IRRIGATION STRYKERFLOW (MISCELLANEOUS) IMPLANT
IRRIGATOR STRYKERFLOW (MISCELLANEOUS)
IRRIGATOR SUCT 8 DISP DVNC XI (IRRIGATION / IRRIGATOR) ×1 IMPLANT
IRRIGATOR SUCTION 8MM XI DISP (IRRIGATION / IRRIGATOR) ×2
IV LACTATED RINGERS 1000ML (IV SOLUTION) ×3 IMPLANT
IV NS 1000ML (IV SOLUTION)
IV NS 1000ML BAXH (IV SOLUTION) IMPLANT
KIT IMAGING PINPOINTPAQ (MISCELLANEOUS) ×3 IMPLANT
KIT PINK PAD W/HEAD ARE REST (MISCELLANEOUS) ×3
KIT PINK PAD W/HEAD ARM REST (MISCELLANEOUS) ×1 IMPLANT
LABEL OR SOLS (LABEL) IMPLANT
NEEDLE HYPO 22GX1.5 SAFETY (NEEDLE) ×3 IMPLANT
NEEDLE INSUFFLATION 14GA 120MM (NEEDLE) ×3 IMPLANT
OBTURATOR OPTICAL STANDARD 8MM (TROCAR) ×2
OBTURATOR OPTICAL STND 8 DVNC (TROCAR) ×1
OBTURATOR OPTICALSTD 8 DVNC (TROCAR) ×1 IMPLANT
PACK COLON CLEAN CLOSURE (MISCELLANEOUS) IMPLANT
PACK LAP CHOLECYSTECTOMY (MISCELLANEOUS) ×3 IMPLANT
PENCIL ELECTRO HAND CTR (MISCELLANEOUS) ×3 IMPLANT
PORT ACCESS TROCAR AIRSEAL 5 (TROCAR) ×3 IMPLANT
RELOAD STAPLER 3.5X45 BLU DVNC (STAPLE) IMPLANT
RELOAD STAPLER 3.5X60 BLU DVNC (STAPLE) ×4 IMPLANT
RETRACTOR RING XSMALL (MISCELLANEOUS) ×1 IMPLANT
RETRACTOR WOUND ALXS 18CM MED (MISCELLANEOUS) ×1 IMPLANT
RTRCTR WOUND ALEXIS 13CM XS SH (MISCELLANEOUS) ×3
RTRCTR WOUND ALEXIS O 18CM MED (MISCELLANEOUS) ×3
SEAL CANN UNIV 5-8 DVNC XI (MISCELLANEOUS) ×3 IMPLANT
SEAL XI 5MM-8MM UNIVERSAL (MISCELLANEOUS) ×6
SEALER VESSEL DA VINCI XI (MISCELLANEOUS) ×2
SEALER VESSEL EXT DVNC XI (MISCELLANEOUS) ×1 IMPLANT
SET TRI-LUMEN FLTR TB AIRSEAL (TUBING) ×3 IMPLANT
SLEEVE ENDOPATH XCEL 5M (ENDOMECHANICALS) ×3 IMPLANT
SOLUTION ELECTROLUBE (MISCELLANEOUS) ×3 IMPLANT
SPONGE DRAIN TRACH 4X4 STRL 2S (GAUZE/BANDAGES/DRESSINGS) ×3 IMPLANT
STAPLER 45 DA VINCI SURE FORM (STAPLE)
STAPLER 45 SUREFORM DVNC (STAPLE) IMPLANT
STAPLER 60 DA VINCI SURE FORM (STAPLE) ×2
STAPLER 60 SUREFORM DVNC (STAPLE) ×1 IMPLANT
STAPLER CANNULA SEAL DVNC XI (STAPLE) ×1 IMPLANT
STAPLER CANNULA SEAL XI (STAPLE) ×2
STAPLER RELOAD 3.5X45 BLU DVNC (STAPLE)
STAPLER RELOAD 3.5X45 BLUE (STAPLE)
STAPLER RELOAD 3.5X60 BLU DVNC (STAPLE) ×4
STAPLER RELOAD 3.5X60 BLUE (STAPLE) ×8
STAPLER SKIN PROX 35W (STAPLE) ×3 IMPLANT
SUT ETHILON 3-0 FS-10 30 BLK (SUTURE) ×3
SUT MNCRL 4-0 (SUTURE) ×4
SUT MNCRL 4-0 27XMFL (SUTURE) ×2
SUT MNCRL AB 4-0 PS2 18 (SUTURE) IMPLANT
SUT PDS AB 1 CT1 36 (SUTURE) IMPLANT
SUT SILK 3-0 (SUTURE) IMPLANT
SUT VIC AB 0 CT1 36 (SUTURE) ×3 IMPLANT
SUT VIC AB 2-0 SH 27 (SUTURE) ×2
SUT VIC AB 2-0 SH 27XBRD (SUTURE) ×1 IMPLANT
SUT VIC AB 3-0 SH 27 (SUTURE) ×4
SUT VIC AB 3-0 SH 27X BRD (SUTURE) ×2 IMPLANT
SUT VICRYL 0 AB UR-6 (SUTURE) IMPLANT
SUT VLOC 90 6 CV-15 VIOLET (SUTURE) IMPLANT
SUT VLOC 90 6" CV-15 VIOLET (SUTURE)
SUT VLOC 90 S/L VL9 GS22 (SUTURE) ×3 IMPLANT
SUTURE EHLN 3-0 FS-10 30 BLK (SUTURE) ×1 IMPLANT
SUTURE MNCRL 4-0 27XMF (SUTURE) ×2 IMPLANT
SYR 30ML LL (SYRINGE) ×6 IMPLANT
SYS LAPSCP GELPORT 120MM (MISCELLANEOUS)
SYSTEM LAPSCP GELPORT 120MM (MISCELLANEOUS) IMPLANT
SYSTEM WECK SHIELD CLOSURE (TROCAR) IMPLANT
TRAY FOLEY MTR SLVR 16FR STAT (SET/KITS/TRAYS/PACK) ×3 IMPLANT
TROCAR XCEL NON-BLD 5MMX100MML (ENDOMECHANICALS) ×3 IMPLANT
TUBING EVAC SMOKE HEATED PNEUM (TUBING) IMPLANT

## 2019-03-10 NOTE — Interval H&P Note (Signed)
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  The various methods of treatment have been discussed with the patient and family. After consideration of risks, benefits and other options for treatment, the patient has consented to  Procedure(s): XI ROBOT ASSISTED LAPAROSCOPIC RT COLECTOMY (Right) as a surgical intervention.  The patient's history has been reviewed, patient examined, no change in status, stable for surgery.  I have reviewed the patient's chart and labs.  Questions were answered to the patient's satisfaction.     Senon Nixon Lysle Pearl

## 2019-03-10 NOTE — Transfer of Care (Signed)
Immediate Anesthesia Transfer of Care Note  Patient: Cassie Gaines  Procedure(s) Performed: XI ROBOT ASSISTED LAPAROSCOPIC RT COLECTOMY (Right Abdomen)  Patient Location: PACU  Anesthesia Type:General  Level of Consciousness: sedated  Airway & Oxygen Therapy: Patient Spontanous Breathing and Patient connected to face mask oxygen  Post-op Assessment: Report given to RN and Post -op Vital signs reviewed and stable  Post vital signs: Reviewed and stable  Last Vitals:  Vitals Value Taken Time  BP 136/79 03/10/19 1910  Temp 36.9 C 03/10/19 1910  Pulse 60 03/10/19 1910  Resp 24 03/10/19 1910  SpO2 100 % 03/10/19 1910    Last Pain:  Vitals:   03/10/19 1910  TempSrc:   PainSc: Asleep         Complications: No apparent anesthesia complications

## 2019-03-10 NOTE — Anesthesia Postprocedure Evaluation (Signed)
Anesthesia Post Note  Patient: Cassie Gaines  Procedure(s) Performed: XI ROBOT ASSISTED LAPAROSCOPIC RT COLECTOMY (Right Abdomen)  Patient location during evaluation: PACU Anesthesia Type: General Level of consciousness: awake and alert Pain management: pain level controlled Vital Signs Assessment: post-procedure vital signs reviewed and stable Respiratory status: spontaneous breathing, nonlabored ventilation and respiratory function stable Cardiovascular status: blood pressure returned to baseline and stable Postop Assessment: no apparent nausea or vomiting Anesthetic complications: no     Last Vitals:  Vitals:   03/10/19 2116 03/10/19 2207  BP: (!) 139/93 (!) 146/87  Pulse: (!) 56 63  Resp: 19 18  Temp: 36.7 C 36.8 C  SpO2: 98% 99%    Last Pain:  Vitals:   03/10/19 2207  TempSrc: Oral  PainSc:                  Tera Mater

## 2019-03-10 NOTE — Anesthesia Procedure Notes (Signed)
Procedure Name: Intubation Date/Time: 03/10/2019 2:04 PM Performed by: Aline Brochure, CRNA Pre-anesthesia Checklist: Patient identified, Emergency Drugs available, Suction available and Patient being monitored Patient Re-evaluated:Patient Re-evaluated prior to induction Oxygen Delivery Method: Circle system utilized Preoxygenation: Pre-oxygenation with 100% oxygen Induction Type: IV induction Ventilation: Mask ventilation without difficulty Laryngoscope Size: McGraph and 3 Grade View: Grade I Tube type: Oral Tube size: 7.0 mm Number of attempts: 1 Airway Equipment and Method: Stylet and Video-laryngoscopy Placement Confirmation: ETT inserted through vocal cords under direct vision,  positive ETCO2 and breath sounds checked- equal and bilateral Secured at: 21 cm Tube secured with: Tape Dental Injury: Teeth and Oropharynx as per pre-operative assessment

## 2019-03-10 NOTE — Anesthesia Preprocedure Evaluation (Signed)
Anesthesia Evaluation  Patient identified by MRN, date of birth, ID band Patient awake    Reviewed: Allergy & Precautions, NPO status , Patient's Chart, lab work & pertinent test results  History of Anesthesia Complications Negative for: history of anesthetic complications  Airway Mallampati: II  TM Distance: >3 FB Neck ROM: Full    Dental  (+)    Pulmonary neg sleep apnea, neg COPD, former smoker,    breath sounds clear to auscultation- rhonchi (-) wheezing      Cardiovascular hypertension, Pt. on medications (-) CAD, (-) Past MI, (-) Cardiac Stents and (-) CABG  Rhythm:Regular Rate:Normal - Systolic murmurs and - Diastolic murmurs    Neuro/Psych neg Seizures negative neurological ROS  negative psych ROS   GI/Hepatic Neg liver ROS, hiatal hernia, GERD  ,  Endo/Other  negative endocrine ROSneg diabetes  Renal/GU negative Renal ROS     Musculoskeletal negative musculoskeletal ROS (+)   Abdominal (+) + obese,   Peds  Hematology  (+) anemia ,   Anesthesia Other Findings Past Medical History: No date: Anemia     Comment:  h/o No date: GERD (gastroesophageal reflux disease) No date: Heart murmur No date: History of hiatal hernia No date: Hypertension No date: Pre-diabetes   Reproductive/Obstetrics                             Anesthesia Physical Anesthesia Plan  ASA: II  Anesthesia Plan: General   Post-op Pain Management:    Induction: Intravenous  PONV Risk Score and Plan: 2 and Dexamethasone, Ondansetron and Midazolam  Airway Management Planned: Oral ETT  Additional Equipment:   Intra-op Plan:   Post-operative Plan: Extubation in OR  Informed Consent: I have reviewed the patients History and Physical, chart, labs and discussed the procedure including the risks, benefits and alternatives for the proposed anesthesia with the patient or authorized representative who has  indicated his/her understanding and acceptance.     Dental advisory given  Plan Discussed with: CRNA and Anesthesiologist  Anesthesia Plan Comments:         Anesthesia Quick Evaluation

## 2019-03-11 LAB — CBC
HCT: 34.9 % — ABNORMAL LOW (ref 36.0–46.0)
Hemoglobin: 11.5 g/dL — ABNORMAL LOW (ref 12.0–15.0)
MCH: 30.5 pg (ref 26.0–34.0)
MCHC: 33 g/dL (ref 30.0–36.0)
MCV: 92.6 fL (ref 80.0–100.0)
Platelets: 299 10*3/uL (ref 150–400)
RBC: 3.77 MIL/uL — ABNORMAL LOW (ref 3.87–5.11)
RDW: 13 % (ref 11.5–15.5)
WBC: 10.6 10*3/uL — ABNORMAL HIGH (ref 4.0–10.5)
nRBC: 0 % (ref 0.0–0.2)

## 2019-03-11 LAB — BASIC METABOLIC PANEL
Anion gap: 10 (ref 5–15)
BUN: 14 mg/dL (ref 8–23)
CO2: 22 mmol/L (ref 22–32)
Calcium: 8.1 mg/dL — ABNORMAL LOW (ref 8.9–10.3)
Chloride: 106 mmol/L (ref 98–111)
Creatinine, Ser: 1.05 mg/dL — ABNORMAL HIGH (ref 0.44–1.00)
GFR calc Af Amer: >60 mL/min (ref >60)
GFR calc non Af Amer: 57 mL/min — ABNORMAL LOW (ref >60)
Glucose, Bld: 109 mg/dL — ABNORMAL HIGH (ref 70–99)
Potassium: 3.9 mmol/L (ref 3.5–5.1)
Sodium: 138 mmol/L (ref 135–145)

## 2019-03-11 MED ORDER — ALUM & MAG HYDROXIDE-SIMETH 200-200-20 MG/5ML PO SUSP
30.0000 mL | ORAL | Status: DC | PRN
  Administered 2019-03-12 – 2019-03-19 (×6): 30 mL via ORAL
  Filled 2019-03-11 (×7): qty 30

## 2019-03-11 NOTE — Op Note (Addendum)
Preoperative diagnosis: Right colon spastic polyp  Postoperative diagnosis: Same  Procedure: Robotic assisted laparoscopic right colectomy.   Anesthesia: GETA   Surgeon: Benjamine Sprague Assistant: Cintron for exposure   Wound Classification: clean contaminated   Specimen: Right colon   Complications: None   Estimated Blood Loss: 80 mL   Indications: Patient is a  64 year old female  presented with above.  Please see H&P for further details.     FIndings: 1.    Cecal colon mass with tattoo 2.  Adequate hemostasis.  4.  No gross metastasis noted   Description of procedure: The patient was placed on the operating table in the supine position, both arms tucked. General anesthesia was induced. A time-out was completed verifying correct patient, procedure, site, positioning, and implant(s) and/or special equipment prior to beginning this procedure. The abdomen was prepped and draped in the usual sterile fashion.    Palmer's point located and Veress needle was inserted.  After confirming 2 clicks and a positive saline drop test, gas insufflation was initiated until the abdominal pressure was measured at 15 mmHg.  Afterwards, the Veress needle was removed and a 8 mm port was placed through the same site using Optiview technique after extending the incision with an 11 blade.  After local was infused, 3 additional incision was made 8 cm apart along the left side of the abdominal wall from the initial incision and two 20mm and one 16mm port placed under direct visualization. 5 mm assistant port was then placed between 2 of the robotic ports.  No injuries from trocar placements were noted.  Exparel infused as a tap block.  The table was placed in the reverse Trendelenburg position with the right side elevated.  Xi robotic platform was then brought to the operative field and docked at an angle from the left lower quadrant.  Tip up grasper and hook cautery was placed in right arm ports.  Fenestrated  bipolar in left arm port.   Examination of the abdominal cavity noted no signs of gross metastasis.  The right colon with inking of the cecum was noted.  Dissection was started by removing the lateral attachments of the right colon along the white line of Toldt, ensuring the right ureter was not involved.  This was carried around the hepatic flexure to the mid portion of the transverse colon.  Afterwards, the right colon was grasped and elevated to visualize mesentary. Point was chosen on the transverse colon for staple transection, measuring at least 5 cm from the mass.  83mm blue load stapler was then used to transect the colon at this point.  Vessel sealer was then used to transect the right colon mesentery towards a previously determined point on the terminal ileum, care taken to ensure as much mesentery was taken for lymph node evaluation, and also visualizing the duodenum and placing it away from area of transection during this portion.  Once the terminal ileum was reached, 35mm blue load stapler was used to transect the terminal ileum.  ICG was then infused and the staple lines were confirmed to have adequate blood flow.   The transected specimen was placed atop the liver.  The terminal ileum was then brought towards the transverse colon and a isoperistaltic anastomosis was created.  Enterotomies were made in the small bowel and the transverse colon, small bowel enterotomy created 2 cm from the staple line and transverse colon enterotomy made 8 cm from the staple line.  60 mm blue load stapler placed through  these enterotomies and new anastomosis created.  The enterotomy was then closed by placing 2 anchor sutures at the 2 apexes using 2-0 Vicryl, then running 3-0 V-Lock in a 2 layer fashion, using surrounding omental fat as reinforcement over the original enterotomy closure.    No bleeding or additional pathology was noted. Robot was then undocked, and the abdomen was allowed to collapse. 12 mm port  removed and incision extended to allow placement of a small Alexis wound protector. Right colon specimen was identified and pulled through this incision.    This port site was then closed primarily using 0 Vicryl x2. Deep dermal then closed with 3-0 vicryl interrupted fashion.    Remaining ports removed.    All skin incisions then closed with subcuticular sutures Monocryl 4-0.  Wounds then dressed with dermabond.   The patient tolerated the procedure well, awakened from anesthesia and was taken to the postanesthesia care unit in satisfactory condition.  Foley still in place.  Sponge count and instrument count correct at the end of the procedure.

## 2019-03-11 NOTE — Progress Notes (Signed)
Subjective:  CC: Cassie Gaines is a 64 y.o. female  Hospital stay day 1, 1 Day Post-Op robotic assisted laparoscopic right colectomy for unresectable dysplastic cecal polyp  HPI: No issues overnight.  Pain is well controlled  ROS:  General: Denies weight loss, weight gain, fatigue, fevers, chills, and night sweats. Heart: Denies chest pain, palpitations, racing heart, irregular heartbeat, leg pain or swelling, and decreased activity tolerance. Respiratory: Denies breathing difficulty, shortness of breath, wheezing, cough, and sputum. GI: Denies change in appetite, heartburn, nausea, vomiting, constipation, diarrhea, and blood in stool. GU: Denies difficulty urinating, pain with urinating, urgency, frequency, blood in urine.   Objective:   Temp:  [98 F (36.7 C)-98.8 F (37.1 C)] 98.8 F (37.1 C) (02/03 1255) Pulse Rate:  [56-85] 71 (02/03 1255) Resp:  [16-24] 18 (02/02 2207) BP: (120-146)/(70-96) 143/96 (02/03 1255) SpO2:  [95 %-100 %] 100 % (02/03 1255) Weight:  [77.6 kg] 77.6 kg (02/03 0839)     Height: 5\' 2"  (157.5 cm) Weight: 77.6 kg BMI (Calculated): 31.27   Intake/Output this shift:   Intake/Output Summary (Last 24 hours) at 03/11/2019 1314 Last data filed at 03/11/2019 1001 Gross per 24 hour  Intake 2979.48 ml  Output 650 ml  Net 2329.48 ml    Constitutional :  alert, cooperative, appears stated age and no distress  Respiratory:  clear to auscultation bilaterally  Cardiovascular:  regular rate and rhythm  Gastrointestinal: soft, non-tender; bowel sounds normal; no masses,  no organomegaly.   Skin: Cool and moist.  Incisions clean dry and intact.  JP with some old serosanguineous fluid.  Psychiatric: Normal affect, non-agitated, not confused       LABS:  CMP Latest Ref Rng & Units 03/11/2019 03/10/2019  Glucose 70 - 99 mg/dL 109(H) -  BUN 8 - 23 mg/dL 14 -  Creatinine 0.44 - 1.00 mg/dL 1.05(H) 1.04(H)  Sodium 135 - 145 mmol/L 138 -  Potassium 3.5 - 5.1 mmol/L 3.9 -   Chloride 98 - 111 mmol/L 106 -  CO2 22 - 32 mmol/L 22 -  Calcium 8.9 - 10.3 mg/dL 8.1(L) -   CBC Latest Ref Rng & Units 03/11/2019 03/10/2019  WBC 4.0 - 10.5 K/uL 10.6(H) 12.4(H)  Hemoglobin 12.0 - 15.0 g/dL 11.5(L) 12.2  Hematocrit 36.0 - 46.0 % 34.9(L) 38.2  Platelets 150 - 400 K/uL 299 303    RADS: n/a Assessment:   S/p robotic assisted laparoscopic right colectomy for unresectable 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  Doing well overnight.  With adequate urine output.  Will remove Foley.  And start clear liquid diet.  Minimal output from JP drain so doubt hemoglobin dropped this sign of active bleeding.  We will continue to monitor closely.

## 2019-03-12 LAB — BASIC METABOLIC PANEL
Anion gap: 7 (ref 5–15)
BUN: 9 mg/dL (ref 8–23)
CO2: 26 mmol/L (ref 22–32)
Calcium: 8.2 mg/dL — ABNORMAL LOW (ref 8.9–10.3)
Chloride: 106 mmol/L (ref 98–111)
Creatinine, Ser: 0.93 mg/dL (ref 0.44–1.00)
GFR calc Af Amer: >60 mL/min (ref >60)
GFR calc non Af Amer: >60 mL/min (ref >60)
Glucose, Bld: 121 mg/dL — ABNORMAL HIGH (ref 70–99)
Potassium: 3.7 mmol/L (ref 3.5–5.1)
Sodium: 139 mmol/L (ref 135–145)

## 2019-03-12 LAB — CBC
HCT: 35.1 % — ABNORMAL LOW (ref 36.0–46.0)
Hemoglobin: 11.5 g/dL — ABNORMAL LOW (ref 12.0–15.0)
MCH: 30.7 pg (ref 26.0–34.0)
MCHC: 32.8 g/dL (ref 30.0–36.0)
MCV: 93.6 fL (ref 80.0–100.0)
Platelets: 278 10*3/uL (ref 150–400)
RBC: 3.75 MIL/uL — ABNORMAL LOW (ref 3.87–5.11)
RDW: 13.3 % (ref 11.5–15.5)
WBC: 12.4 10*3/uL — ABNORMAL HIGH (ref 4.0–10.5)
nRBC: 0 % (ref 0.0–0.2)

## 2019-03-12 NOTE — Progress Notes (Signed)
Subjective:  CC: Cassie Gaines is a 64 y.o. female  Hospital stay day 2, 2 Days Post-Op robotic assisted laparoscopic right colectomy for unresectable dysplastic cecal polyp  HPI: No issues overnight.  Pain is well controlled. Tolerating clears  ROS:  General: Denies weight loss, weight gain, fatigue, fevers, chills, and night sweats. Heart: Denies chest pain, palpitations, racing heart, irregular heartbeat, leg pain or swelling, and decreased activity tolerance. Respiratory: Denies breathing difficulty, shortness of breath, wheezing, cough, and sputum. GI: Denies change in appetite, heartburn, nausea, vomiting, constipation, diarrhea, and blood in stool. GU: Denies difficulty urinating, pain with urinating, urgency, frequency, blood in urine.   Objective:   Temp:  [97.7 F (36.5 C)-99.5 F (37.5 C)] 99.2 F (37.3 C) (02/04 1139) Pulse Rate:  [68-94] 89 (02/04 1139) Resp:  [16-20] 16 (02/04 1139) BP: (121-151)/(87-96) 121/95 (02/04 1139) SpO2:  [93 %-100 %] 94 % (02/04 1139)     Height: 5\' 2"  (157.5 cm) Weight: 77.6 kg BMI (Calculated): 31.27   Intake/Output this shift:   Intake/Output Summary (Last 24 hours) at 03/12/2019 1149 Last data filed at 03/12/2019 [id number] Gross per 24 hour  Intake 1840 ml  Output 975 ml  Net 865 ml    Constitutional :  alert, cooperative, appears stated age and no distress  Respiratory:  clear to auscultation bilaterally  Cardiovascular:  regular rate and rhythm  Gastrointestinal: soft, non-tender; bowel sounds normal; no masses,  no organomegaly.   Skin: Cool and moist.  Incisions clean dry and intact.  JP with some old dark serous fluid.  Psychiatric: Normal affect, non-agitated, not confused       LABS:  CMP Latest Ref Rng & Units 03/12/2019 03/11/2019 03/10/2019  Glucose 70 - 99 mg/dL 121(H) 109(H) -  BUN 8 - 23 mg/dL 9 14 -  Creatinine 0.44 - 1.00 mg/dL 0.93 1.05(H) 1.04(H)  Sodium 135 - 145 mmol/L 139 138 -  Potassium 3.5 - 5.1 mmol/L 3.7 3.9 -   Chloride 98 - 111 mmol/L 106 106 -  CO2 22 - 32 mmol/L 26 22 -  Calcium 8.9 - 10.3 mg/dL 8.2(L) 8.1(L) -   CBC Latest Ref Rng & Units 03/12/2019 03/11/2019 03/10/2019  WBC 4.0 - 10.5 K/uL 12.4(H) 10.6(H) 12.4(H)  Hemoglobin 12.0 - 15.0 g/dL 11.5(L) 11.5(L) 12.2  Hematocrit 36.0 - 46.0 % 35.1(L) 34.9(L) 38.2  Platelets 150 - 400 K/uL 278 299 303    RADS: n/a Assessment:   S/p robotic assisted laparoscopic right colectomy for unresectable 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  Doing well overnight.  Tolerating clears, but noted to have increased wbc today. Drain output and clinical exam benign.  Will stay on clears for today just to be safe and repeat wbc tomorrow.  If wbc normal, can consider advancing to regular diet

## 2019-03-13 ENCOUNTER — Inpatient Hospital Stay: Payer: No Typology Code available for payment source

## 2019-03-13 LAB — CBC WITH DIFFERENTIAL/PLATELET
Abs Immature Granulocytes: 0.04 10*3/uL (ref 0.00–0.07)
Basophils Absolute: 0 10*3/uL (ref 0.0–0.1)
Basophils Relative: 0 %
Eosinophils Absolute: 0.1 10*3/uL (ref 0.0–0.5)
Eosinophils Relative: 1 %
HCT: 37.9 % (ref 36.0–46.0)
Hemoglobin: 12.3 g/dL (ref 12.0–15.0)
Immature Granulocytes: 0 %
Lymphocytes Relative: 7 %
Lymphs Abs: 0.9 10*3/uL (ref 0.7–4.0)
MCH: 30.3 pg (ref 26.0–34.0)
MCHC: 32.5 g/dL (ref 30.0–36.0)
MCV: 93.3 fL (ref 80.0–100.0)
Monocytes Absolute: 0.7 10*3/uL (ref 0.1–1.0)
Monocytes Relative: 5 %
Neutro Abs: 12 10*3/uL — ABNORMAL HIGH (ref 1.7–7.7)
Neutrophils Relative %: 87 %
Platelets: 295 10*3/uL (ref 150–400)
RBC: 4.06 MIL/uL (ref 3.87–5.11)
RDW: 13.2 % (ref 11.5–15.5)
WBC: 13.7 10*3/uL — ABNORMAL HIGH (ref 4.0–10.5)
nRBC: 0 % (ref 0.0–0.2)

## 2019-03-13 LAB — SURGICAL PATHOLOGY

## 2019-03-13 MED ORDER — PROMETHAZINE HCL 25 MG/ML IJ SOLN
25.0000 mg | Freq: Four times a day (QID) | INTRAMUSCULAR | Status: DC | PRN
  Administered 2019-03-14 – 2019-03-20 (×2): 25 mg via INTRAVENOUS
  Filled 2019-03-13 (×2): qty 1

## 2019-03-13 MED ORDER — HYDRALAZINE HCL 20 MG/ML IJ SOLN
10.0000 mg | Freq: Four times a day (QID) | INTRAMUSCULAR | Status: DC | PRN
  Administered 2019-03-13 – 2019-03-14 (×2): 10 mg via INTRAVENOUS
  Filled 2019-03-13 (×2): qty 1

## 2019-03-13 MED ORDER — KCL IN DEXTROSE-NACL 20-5-0.45 MEQ/L-%-% IV SOLN
INTRAVENOUS | Status: DC
  Filled 2019-03-13 (×15): qty 1000

## 2019-03-13 MED ORDER — LISINOPRIL 20 MG PO TABS
40.0000 mg | ORAL_TABLET | Freq: Every day | ORAL | Status: DC
  Filled 2019-03-13: qty 2

## 2019-03-13 NOTE — Progress Notes (Signed)
Subjective:  CC: Cassie Gaines is a 64 y.o. female  Hospital stay day 3, 3 Days Post-Op robotic assisted laparoscopic right colectomy for unresectable dysplastic cecal polyp  HPI: Episodes of emesis overnight, NG in place.  Pt states she is having some pain, but overall no worsening.  Had multiple BMs prior to emesis.  Was on clears.   ROS:  General: Denies weight loss, weight gain, fatigue, fevers, chills, and night sweats. Heart: Denies chest pain, palpitations, racing heart, irregular heartbeat, leg pain or swelling, and decreased activity tolerance. Respiratory: Denies breathing difficulty, shortness of breath, wheezing, cough, and sputum. GI: Denies change in appetite, constipation, diarrhea, and blood in stool. GU: Denies difficulty urinating, pain with urinating, urgency, frequency, blood in urine.   Objective:   Temp:  [98.4 F (36.9 C)-99.2 F (37.3 C)] 98.4 F (36.9 C) (02/05 0352) Pulse Rate:  [76-89] 80 (02/05 0352) Resp:  [16] 16 (02/05 0352) BP: (121-181)/(88-102) 152/88 (02/05 0352) SpO2:  [94 %-96 %] 94 % (02/05 0352)     Height: 5\' 2"  (157.5 cm) Weight: 77.6 kg BMI (Calculated): 31.27   Intake/Output this shift:   Intake/Output Summary (Last 24 hours) at 03/13/2019 0748 Last data filed at 03/13/2019 [id number] Gross per 24 hour  Intake 1677 ml  Output 1680 ml  Net -3 ml    Constitutional :  alert, cooperative, appears stated age and no distress  Respiratory:  clear to auscultation bilaterally  Cardiovascular:  regular rate and rhythm  Gastrointestinal: still soft, no guarding at all.  increased serous output noted from JP.  no evidence of bile.  incisions c/d/i.Marland Kitchen   Skin: Cool and moist.  Incisions clean dry and intact.  JP with some old dark serous fluid.  Psychiatric: Normal affect, non-agitated, not confused       LABS:  CMP Latest Ref Rng & Units 03/12/2019 03/11/2019 03/10/2019  Glucose 70 - 99 mg/dL 121(H) 109(H) -  BUN 8 - 23 mg/dL 9 14 -  Creatinine 0.44 -  1.00 mg/dL 0.93 1.05(H) 1.04(H)  Sodium 135 - 145 mmol/L 139 138 -  Potassium 3.5 - 5.1 mmol/L 3.7 3.9 -  Chloride 98 - 111 mmol/L 106 106 -  CO2 22 - 32 mmol/L 26 22 -  Calcium 8.9 - 10.3 mg/dL 8.2(L) 8.1(L) -   CBC Latest Ref Rng & Units 03/12/2019 03/11/2019 03/10/2019  WBC 4.0 - 10.5 K/uL 12.4(H) 10.6(H) 12.4(H)  Hemoglobin 12.0 - 15.0 g/dL 11.5(L) 11.5(L) 12.2  Hematocrit 36.0 - 46.0 % 35.1(L) 34.9(L) 38.2  Platelets 150 - 400 K/uL 278 299 303    RADS: CLINICAL DATA:  NG tube placement.  EXAM: ABDOMEN - 1 VIEW  COMPARISON:  None.  FINDINGS: The NG tube is in good position. The tip is in the antropyloric region of the stomach.  Dilated small bowel loops suggesting small bowel obstruction.  Air collection at the right lung base. Could not exclude free intraperitoneal air. Recommend a left-side-down, right-side-up decubitus film for further evaluation.  IMPRESSION: 1. NG tube in good position. 2. Suspect free air under the right hemidiaphragm. Recommend left-side-down, right-side-up decubitus film. 3. Distended small bowel loops suggesting small bowel obstruction.  These results will be called to the ordering clinician or representative by the Radiologist Assistant, and communication documented in the PACS or zVision Dashboard.   Electronically Signed   By: Marijo Sanes M.D.   On: 03/13/2019 07:07  Assessment:   S/p robotic assisted laparoscopic right colectomy for unresectable 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  Episodes of emesis overnight, NG in place.  Pt states she is having some pain, but overall no worsening.  Had multiple BMs prior to emesis.  Was on clears.  Will await cbc results and see if increasing, may need to consider CT if it is to assess anastamosis.  Exam is very benign at this point, so doubt free air under diaphragm is new, likely still residual from surgery.

## 2019-03-13 NOTE — Progress Notes (Signed)
Contacted by patient RN, Marzetta Board.  Has had n/v, with about 600 cc bilious emesis just now. No NGT in place and no IVF running, as patient had been tolerating clears.  Was given Zofran by RN. Will make NPO except for sips with meds and start IVF for now, until Dr. Lysle Pearl can re-evaluate in the AM.

## 2019-03-13 NOTE — Plan of Care (Signed)
Continuing with plan of care. 

## 2019-03-13 NOTE — Progress Notes (Signed)
Patient had an emesis output of 600 mL. Notified Dr. Celine Ahr and she ordered patient to be NPO except sips with meds and D5 1/2 NS with 20 KCL @ 62mL/hr. Will continue to monitor.  Christene Slates

## 2019-03-13 NOTE — Progress Notes (Addendum)
Patient had emesis output of 200 mL. Per Dr. Celine Ahr, 16 Fr. NG tube was placed in right nare at 64 cm. There was about 189mL of green output in cannister after NG tube was placed.  Xray was ordered to verify placement.  Will continue to monitor.  Cassie Gaines

## 2019-03-14 LAB — BASIC METABOLIC PANEL
Anion gap: 8 (ref 5–15)
BUN: 9 mg/dL (ref 8–23)
CO2: 30 mmol/L (ref 22–32)
Calcium: 8.2 mg/dL — ABNORMAL LOW (ref 8.9–10.3)
Chloride: 104 mmol/L (ref 98–111)
Creatinine, Ser: 0.93 mg/dL (ref 0.44–1.00)
GFR calc Af Amer: >60 mL/min (ref >60)
GFR calc non Af Amer: >60 mL/min (ref >60)
Glucose, Bld: 115 mg/dL — ABNORMAL HIGH (ref 70–99)
Potassium: 3.4 mmol/L — ABNORMAL LOW (ref 3.5–5.1)
Sodium: 142 mmol/L (ref 135–145)

## 2019-03-14 LAB — CBC
HCT: 36.5 % (ref 36.0–46.0)
Hemoglobin: 12.1 g/dL (ref 12.0–15.0)
MCH: 30.6 pg (ref 26.0–34.0)
MCHC: 33.2 g/dL (ref 30.0–36.0)
MCV: 92.4 fL (ref 80.0–100.0)
Platelets: 301 10*3/uL (ref 150–400)
RBC: 3.95 MIL/uL (ref 3.87–5.11)
RDW: 13.2 % (ref 11.5–15.5)
WBC: 11.6 10*3/uL — ABNORMAL HIGH (ref 4.0–10.5)
nRBC: 0 % (ref 0.0–0.2)

## 2019-03-14 LAB — PHOSPHORUS: Phosphorus: 2 mg/dL — ABNORMAL LOW (ref 2.5–4.6)

## 2019-03-14 LAB — MAGNESIUM: Magnesium: 2 mg/dL (ref 1.7–2.4)

## 2019-03-14 MED ORDER — POTASSIUM PHOSPHATES 15 MMOLE/5ML IV SOLN
20.0000 mmol | Freq: Once | INTRAVENOUS | Status: AC
  Administered 2019-03-14: 10:00:00 20 mmol via INTRAVENOUS
  Filled 2019-03-14: qty 6.67

## 2019-03-14 NOTE — Plan of Care (Signed)
Continuing with plan of care. 

## 2019-03-14 NOTE — Progress Notes (Signed)
[facility] Day(s): 4.   Post op day(s): 4 Days Post-Op.   Interval History: Patient seen and examined, no acute events or new complaints overnight. Patient reports feeling better today compared to yesterday. Patient reports that yesterday she did basically did not feel well but today her head and her body felt much better. She denies any more nausea at this moment. She does endorse that she had a very small watery stool.  Vital signs in last 24 hours: [min-max] current  Temp:  [98.2 F (36.8 C)-98.6 F (37 C)] 98.4 F (36.9 C) (02/06 0527) Pulse Rate:  [76-96] 76 (02/06 0527) Resp:  [16-18] 16 (02/06 0527) BP: (128-170)/(85-105) 128/85 (02/06 0527) SpO2:  [97 %] 97 % (02/06 0527)     Height: 5\' 2"  (157.5 cm) Weight: 77.6 kg BMI (Calculated): 31.27   NGT: 750 mL (bilious)  Physical Exam:  Constitutional: alert, cooperative and no distress  Respiratory: breathing non-labored at rest  Cardiovascular: regular rate and sinus rhythm  Gastrointestinal: soft, mild-tender, and mild-distended  Labs:  CBC Latest Ref Rng & Units 03/14/2019 03/13/2019 03/12/2019  WBC 4.0 - 10.5 K/uL 11.6(H) 13.7(H) 12.4(H)  Hemoglobin 12.0 - 15.0 g/dL 12.1 12.3 11.5(L)  Hematocrit 36.0 - 46.0 % 36.5 37.9 35.1(L)  Platelets 150 - 400 K/uL 301 295 278   CMP Latest Ref Rng & Units 03/14/2019 03/13/2019 03/12/2019  Glucose 70 - 99 mg/dL 115(H) - 121(H)  BUN 8 - 23 mg/dL 9 - 9  Creatinine 0.44 - 1.00 mg/dL 0.93 0.90 0.93  Sodium 135 - 145 mmol/L 142 - 139  Potassium 3.5 - 5.1 mmol/L 3.4(L) - 3.7  Chloride 98 - 111 mmol/L 104 - 106  CO2 22 - 32 mmol/L 30 - 26  Calcium 8.9 - 10.3 mg/dL 8.2(L) - 8.2(L)    Imaging studies: Abdominal x-ray from yesterday showed distended small bowel. There is gas in the distal colon. Right upper quadrant free air without change.   Assessment/Plan:  64 y.o. female with unresectable dysplastic cecal polyp 4 Days Post-Op s/p robotic assisted laparoscopic right  hemicolectomy, complicated by pertinent comorbidities including hypertension.  Patient with most likely a postoperative ileus that is slowly resolving. Patient definitely doing much better today. She is ambulating in the room and she looks comfortable. She had a very little small loose watery stool. She does not endorses any nausea this morning. I will replace electrolytes and continue NGT for today. If she continue to pass gas and bowel movement will consider removing NG tomorrow after clamping trial. The white blood cell count in decreasing trend this morning. There has been no fever and no tachycardia. Patient on DVT prophylaxis. Encourage patient to continue ambulation. We will continue with current patient management and avoid narcotic as possible. No indication for antibiotic at this moment.  Arnold Long, MD

## 2019-03-15 ENCOUNTER — Inpatient Hospital Stay: Payer: No Typology Code available for payment source

## 2019-03-15 LAB — BASIC METABOLIC PANEL
Anion gap: 10 (ref 5–15)
BUN: 7 mg/dL — ABNORMAL LOW (ref 8–23)
CO2: 28 mmol/L (ref 22–32)
Calcium: 8.4 mg/dL — ABNORMAL LOW (ref 8.9–10.3)
Chloride: 101 mmol/L (ref 98–111)
Creatinine, Ser: 0.84 mg/dL (ref 0.44–1.00)
GFR calc Af Amer: >60 mL/min (ref >60)
GFR calc non Af Amer: >60 mL/min (ref >60)
Glucose, Bld: 125 mg/dL — ABNORMAL HIGH (ref 70–99)
Potassium: 3.6 mmol/L (ref 3.5–5.1)
Sodium: 139 mmol/L (ref 135–145)

## 2019-03-15 LAB — CBC
HCT: 38.5 % (ref 36.0–46.0)
Hemoglobin: 12.8 g/dL (ref 12.0–15.0)
MCH: 30.5 pg (ref 26.0–34.0)
MCHC: 33.2 g/dL (ref 30.0–36.0)
MCV: 91.7 fL (ref 80.0–100.0)
Platelets: 366 10*3/uL (ref 150–400)
RBC: 4.2 MIL/uL (ref 3.87–5.11)
RDW: 13.6 % (ref 11.5–15.5)
WBC: 8.1 10*3/uL (ref 4.0–10.5)
nRBC: 0 % (ref 0.0–0.2)

## 2019-03-15 LAB — PHOSPHORUS: Phosphorus: 3.1 mg/dL (ref 2.5–4.6)

## 2019-03-15 LAB — MAGNESIUM: Magnesium: 2.2 mg/dL (ref 1.7–2.4)

## 2019-03-15 MED ORDER — IOHEXOL 9 MG/ML PO SOLN
500.0000 mL | ORAL | Status: AC
  Administered 2019-03-15 (×2): 500 mL via ORAL

## 2019-03-15 MED ORDER — IOHEXOL 300 MG/ML  SOLN
100.0000 mL | Freq: Once | INTRAMUSCULAR | Status: AC | PRN
  Administered 2019-03-15: 100 mL via INTRAVENOUS

## 2019-03-15 NOTE — Plan of Care (Signed)
Continuing with plan of care. 

## 2019-03-15 NOTE — Progress Notes (Signed)
[facility] Day(s): 5.   Post op day(s): 5 Days Post-Op.   Interval History: Patient seen and examined, no acute events or new complaints overnight. Patient reports feeling nauseous again this morning.  Patient was feeling much better yesterday during the morning and she was eager to try something to drink.  After trying clear liquid, she gets nauseous again and NGT was placed to suction again.  This morning the patient with persistent nauseous.  Denies vomiting.  Denies fever or chills.  Reports she had a small watery bowel movement last night.  Vital signs in last 24 hours: [min-max] current  Temp:  [98.4 F (36.9 C)-98.8 F (37.1 C)] 98.8 F (37.1 C) (02/07 0521) Pulse Rate:  [73-87] 87 (02/07 0521) Resp:  [16-18] 18 (02/07 0521) BP: (143-171)/(81-101) 143/87 (02/07 0521) SpO2:  [97 %-100 %] 97 % (02/07 0521)     Height: 5\' 2"  (157.5 cm) Weight: 77.6 kg BMI (Calculated): 31.27   Drain: 125 mL (serous)  Physical Exam:  Constitutional: alert, cooperative and no distress  Respiratory: breathing non-labored at rest  Cardiovascular: regular rate and sinus rhythm  Gastrointestinal: soft, non-tender, and non-distended  Labs:  CBC Latest Ref Rng & Units 03/15/2019 03/14/2019 03/13/2019  WBC 4.0 - 10.5 K/uL 8.1 11.6(H) 13.7(H)  Hemoglobin 12.0 - 15.0 g/dL 12.8 12.1 12.3  Hematocrit 36.0 - 46.0 % 38.5 36.5 37.9  Platelets 150 - 400 K/uL 366 301 295   CMP Latest Ref Rng & Units 03/15/2019 03/14/2019 03/13/2019  Glucose 70 - 99 mg/dL 125(H) 115(H) -  BUN 8 - 23 mg/dL 7(L) 9 -  Creatinine 0.44 - 1.00 mg/dL 0.84 0.93 0.90  Sodium 135 - 145 mmol/L 139 142 -  Potassium 3.5 - 5.1 mmol/L 3.6 3.4(L) -  Chloride 98 - 111 mmol/L 101 104 -  CO2 22 - 32 mmol/L 28 30 -  Calcium 8.9 - 10.3 mg/dL 8.4(L) 8.2(L) -    Imaging studies: No new pertinent imaging studies   Assessment/Plan:  64 y.o. female with unresectable dysplastic cecal polyp 5 Days Post-Op s/p robotic assisted  laparoscopic right hemicolectomy, complicated by pertinent comorbidities including hypertension.  Patient with recurrent ileus after a second trial of clear liquids.  NGT was placed back to suction.  This morning patient still nauseous.  She endorses still passing watery stool but no significant gas.  The fact that we are on day #5 postop and she has recurrent ileus I will order CT scan of the abdomen and pelvis with oral contrast for further evaluation and rule out intra-abdominal abscess or small leak as the cause of the ileus.  If CT scan is negative we will continue treating patient has postop ileus with IV fluid, bowel rest, NGT, replacing electrolytes as needed which are much better today.  No white blood cells continuing decreasing drain now at 8.1.  There has been no fever or chills.  I have low suspicious of any intra-abdominal infection but it needs to be ruled out due to the persistent ileus.  Otherwise patient is comfortable and without any distress.  We will continue with DVT prophylaxis.  I encouraged the patient to ambulate.  No indication for antibiotic at this moment.  Arnold Long, MD

## 2019-03-16 MED ORDER — MENTHOL 3 MG MT LOZG
1.0000 | LOZENGE | OROMUCOSAL | Status: DC | PRN
  Administered 2019-03-16: 3 mg via ORAL
  Filled 2019-03-16 (×2): qty 9

## 2019-03-16 NOTE — Progress Notes (Signed)
Initial Nutrition Assessment  DOCUMENTATION CODES:   Obesity unspecified  INTERVENTION:   RD will monitor for diet advancement vs the need for nutrition support  Recommend TPN if unable to advance diet in the next 24 hrs  NUTRITION DIAGNOSIS:   Inadequate oral intake related to acute illness as evidenced by NPO status.  GOAL:   Patient will meet greater than or equal to 90% of their needs  MONITOR:   Diet advancement, Labs, Weight trends, Skin, I & O's  REASON FOR ASSESSMENT:   NPO/Clear Liquid Diet    ASSESSMENT:   64 y/o female with h/o HTN, GERD, hiatal herna and anemia s/p R colectomy secondary to cecal mass, now complicated by post op ileus  RD working remotely.  Pt with post op ileus. Pt has been on NPO/clear liquid diet since admit and is now without adequate nutrition for 7 days. Pt was eating 100% of her clear liquid diet on 2/4. Pt initiated on IV dextrose; refeed labs being monitored. NGT in place with 1167ml output. Per MD note, plan is for clamp trial today. Recommend TPN if unable to advance diet in the next 24 hours.    Medications reviewed and include: lovenox, NaCl w/ 5% dextrose @75ml /hr  Labs reviewed: K 3.6 wnl, P 3.1 wnl, Mg 2.2 wnl  Unable to complete Nutrition-Focused physical exam at this time.   Diet Order:   Diet Order            Diet NPO time specified Except for: Sips with Meds  Diet effective now             EDUCATION NEEDS:   No education needs have been identified at this time  Skin:  Skin Assessment: Reviewed RN Assessment(incision abdomen)  Last BM:  2/8- type 7  Height:   Ht Readings from Last 1 Encounters:  03/11/19 5\' 2"  (1.575 m)    Weight:   Wt Readings from Last 1 Encounters:  03/11/19 77.6 kg    Ideal Body Weight:  50 kg  BMI:  Body mass index is 31.28 kg/m.  Estimated Nutritional Needs:   Kcal:  1700-2000kcal/day  Protein:  85-100g/day  Fluid:  >1.5L/day  Koleen Distance MS, RD, LDN Contact  information available in Amion

## 2019-03-16 NOTE — Progress Notes (Addendum)
Subjective:  CC: Cassie Gaines is a 64 y.o. female  Hospital stay day 6, 6 Days Post-Op robotic assisted laparoscopic right colectomy for unresectable dysplastic cecal polyp  HPI: Doing much better again today.  Continues to have watery BM.  No pain.  ROS:  General: Denies weight loss, weight gain, fatigue, fevers, chills, and night sweats. Heart: Denies chest pain, palpitations, racing heart, irregular heartbeat, leg pain or swelling, and decreased activity tolerance. Respiratory: Denies breathing difficulty, shortness of breath, wheezing, cough, and sputum. GI: Denies change in appetite, constipation, diarrhea, and blood in stool. GU: Denies difficulty urinating, pain with urinating, urgency, frequency, blood in urine.   Objective:   Temp:  [98.1 F (36.7 C)-98.2 F (36.8 C)] 98.2 F (36.8 C) (02/08 0500) Pulse Rate:  [75-79] 75 (02/08 0500) Resp:  [16] 16 (02/08 0500) BP: (142-163)/(81-92) 142/92 (02/08 0500) SpO2:  [97 %-98 %] 98 % (02/08 0500)     Height: 5\' 2"  (157.5 cm) Weight: 77.6 kg BMI (Calculated): 31.27   Intake/Output this shift:   Intake/Output Summary (Last 24 hours) at 03/16/2019 0719 Last data filed at 03/16/2019 0507 Gross per 24 hour  Intake 1963.89 ml  Output 1970 ml  Net -6.11 ml    Constitutional :  alert, cooperative, appears stated age and no distress  Respiratory:  clear to auscultation bilaterally  Cardiovascular:  regular rate and rhythm  Gastrointestinal: still soft, no TTP, no guarding at all even with deep palpation.  thin serous output noted from JP.  incisions c/d/i.Marland Kitchen   Skin: Cool and moist.  Incisions clean dry and intact.  JP with some old dark serous fluid.  Psychiatric: Normal affect, non-agitated, not confused       LABS:  CMP Latest Ref Rng & Units 03/15/2019 03/14/2019 03/13/2019  Glucose 70 - 99 mg/dL 125(H) 115(H) -  BUN 8 - 23 mg/dL 7(L) 9 -  Creatinine 0.44 - 1.00 mg/dL 0.84 0.93 0.90  Sodium 135 - 145 mmol/L 139 142 -  Potassium 3.5  - 5.1 mmol/L 3.6 3.4(L) -  Chloride 98 - 111 mmol/L 101 104 -  CO2 22 - 32 mmol/L 28 30 -  Calcium 8.9 - 10.3 mg/dL 8.4(L) 8.2(L) -   CBC Latest Ref Rng & Units 03/15/2019 03/14/2019 03/13/2019  WBC 4.0 - 10.5 K/uL 8.1 11.6(H) 13.7(H)  Hemoglobin 12.0 - 15.0 g/dL 12.8 12.1 12.3  Hematocrit 36.0 - 46.0 % 38.5 36.5 37.9  Platelets 150 - 400 K/uL 366 301 295    RADS: CLINICAL DATA:  Postoperative ileus. Postop day 5 from right hemicolectomy.  EXAM: CT ABDOMEN AND PELVIS WITH CONTRAST  TECHNIQUE: Multidetector CT imaging of the abdomen and pelvis was performed using the standard protocol following bolus administration of intravenous contrast.  CONTRAST:  178mL OMNIPAQUE IOHEXOL 300 MG/ML  SOLN  COMPARISON:  None.  FINDINGS: Lower chest: Bibasilar atelectasis.  Hepatobiliary: No suspicious focal abnormality within the liver parenchyma. There is no evidence for gallstones, gallbladder wall thickening, or pericholecystic fluid. No intrahepatic or extrahepatic biliary dilation.  Pancreas: No focal mass lesion. No dilatation of the main duct. No intraparenchymal cyst. No peripancreatic edema.  Spleen: No splenomegaly. No focal mass lesion.  Adrenals/Urinary Tract: No adrenal nodule or mass. Kidneys unremarkable. No evidence for hydroureter. The urinary bladder appears normal for the degree of distention.  Stomach/Bowel: NG tube tip is in the gastric antrum. Stomach is moderately distended with contrast material. Duodenum is normally positioned as is the ligament of Treitz. Mid small bowel loops  are distended up to 3.3 cm diameter. Small bowel leading into the enterocolonic anastomosis is fluid-filled and nondilated. There is a mottled appearance of extraluminal gas around the enterocolic anastomotic site with a 5 x 2.3 cm collection of fluid and gas seen just medial to the anastomosis on 44/2. 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  Vascular/Lymphatic: No abdominal aortic aneurysm. No abdominal lymphadenopathy No pelvic sidewall lymphadenopathy.  Reproductive: Uterus is enlarged and lobular consistent with fibroid disease. There is no adnexal mass.  Other: No substantial intraperitoneal free fluid. JP drain enters via the left lower anterior abdominal wall and extends across the midline with the tip just deep to the right anterior abdominal wall adjacent to the anastomosis. Intraperitoneal free air is evident with gas in the anterior abdominal wall and subcutaneous tissues of the abdomen.  Musculoskeletal: There is edema or hemorrhage in the left lateral abdominal wall musculature.  IMPRESSION: 1. Intraperitoneal free air identified on this study, but is not unexpected on postoperative day 5. However, there is mottled appearance of extraluminal gas at the anastomosis representing clustered tiny locules of gas around the enterocolic anastomotic site with a 5 x 2.3 cm collection of fluid and gas just medial to the anastomosis. Again, on postoperative day 5 this could be postsurgical, but imaging features raise concern for anastomotic leak. 2. Fluid-filled distended mid small bowel compatible with ileus. Small bowel leading into the anastomosis is nondistended and fluid-filled. 3. No substantial intraperitoneal free fluid. No evidence for rim enhancing intraperitoneal fluid collection to suggest involving abscess. 4. Edema or hemorrhage in the left lateral abdominal wall musculature. 5. NG tube tip is in the gastric antrum.   Electronically Signed   By: Misty Stanley M.D.   On: 03/15/2019 13:54  Assessment:   S/p robotic assisted laparoscopic right colectomy for unresectable 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  CT concerns for contained anastamosis leak, but clinically doing well again with minium symptoms while NG on suction and  NPO.  Will try another clamp trial again and see if she tolerates, will discuss case with IR in the meantime to see if fluid collection adjacent to anastamosis is amenable to drainage.  Still not sick enough to warrant another surgery to recreate anastamosis, but also cannot continue with NPO and NG tube status.  Discussed plans with patient and she is in agreement.  Will continue to monitor closely  Addendum: After 3 hours of NG tube clamped residual only 25 cc.  She had additional bowel movement this morning again as well.  We will start her on a clear liquid diet and see if she tolerates prior to removing NG tube.  ADDENDUM:  Tolerating some sips of clears this pm.  Recheck NG, patent, with no output.  Still feels distended on exam though.  Will continue NG clamp and see how she does overnight.  Recheck cbc tomorrow as well.

## 2019-03-16 NOTE — Progress Notes (Signed)
Clamp trial attempted this morning.  She had only 25 cc when hooked back up to suction.  MD started her on clear liquids with NG still in place, clamped.  She also had a BM this morning and has ambulated around the nurses station today.

## 2019-03-17 ENCOUNTER — Encounter: Payer: Self-pay | Admitting: Surgery

## 2019-03-17 ENCOUNTER — Inpatient Hospital Stay: Payer: No Typology Code available for payment source

## 2019-03-17 LAB — BASIC METABOLIC PANEL
Anion gap: 9 (ref 5–15)
BUN: 7 mg/dL — ABNORMAL LOW (ref 8–23)
CO2: 27 mmol/L (ref 22–32)
Calcium: 8.4 mg/dL — ABNORMAL LOW (ref 8.9–10.3)
Chloride: 101 mmol/L (ref 98–111)
Creatinine, Ser: 0.82 mg/dL (ref 0.44–1.00)
GFR calc Af Amer: >60 mL/min (ref >60)
GFR calc non Af Amer: >60 mL/min (ref >60)
Glucose, Bld: 120 mg/dL — ABNORMAL HIGH (ref 70–99)
Potassium: 3.9 mmol/L (ref 3.5–5.1)
Sodium: 137 mmol/L (ref 135–145)

## 2019-03-17 LAB — CBC WITH DIFFERENTIAL/PLATELET
Abs Immature Granulocytes: 0.05 10*3/uL (ref 0.00–0.07)
Basophils Absolute: 0 10*3/uL (ref 0.0–0.1)
Basophils Relative: 0 %
Eosinophils Absolute: 0.4 10*3/uL (ref 0.0–0.5)
Eosinophils Relative: 5 %
HCT: 38 % (ref 36.0–46.0)
Hemoglobin: 12.2 g/dL (ref 12.0–15.0)
Immature Granulocytes: 1 %
Lymphocytes Relative: 21 %
Lymphs Abs: 1.4 10*3/uL (ref 0.7–4.0)
MCH: 30 pg (ref 26.0–34.0)
MCHC: 32.1 g/dL (ref 30.0–36.0)
MCV: 93.6 fL (ref 80.0–100.0)
Monocytes Absolute: 0.8 10*3/uL (ref 0.1–1.0)
Monocytes Relative: 12 %
Neutro Abs: 4.2 10*3/uL (ref 1.7–7.7)
Neutrophils Relative %: 61 %
Platelets: 379 10*3/uL (ref 150–400)
RBC: 4.06 MIL/uL (ref 3.87–5.11)
RDW: 13.2 % (ref 11.5–15.5)
WBC: 6.9 10*3/uL (ref 4.0–10.5)
nRBC: 0 % (ref 0.0–0.2)

## 2019-03-17 LAB — PHOSPHORUS: Phosphorus: 3.4 mg/dL (ref 2.5–4.6)

## 2019-03-17 LAB — MAGNESIUM: Magnesium: 2 mg/dL (ref 1.7–2.4)

## 2019-03-17 MED ORDER — SODIUM CHLORIDE 0.9 % IV SOLN
INTRAVENOUS | Status: DC | PRN
  Administered 2019-03-17 – 2019-03-21 (×2): 250 mL via INTRAVENOUS

## 2019-03-17 MED ORDER — PIPERACILLIN-TAZOBACTAM 3.375 G IVPB
3.3750 g | Freq: Three times a day (TID) | INTRAVENOUS | Status: DC
  Administered 2019-03-17 – 2019-03-20 (×10): 3.375 g via INTRAVENOUS
  Filled 2019-03-17 (×10): qty 50

## 2019-03-17 MED ORDER — IOHEXOL 9 MG/ML PO SOLN
500.0000 mL | ORAL | Status: AC
  Administered 2019-03-17 (×2): 500 mL via ORAL

## 2019-03-17 MED ORDER — IOHEXOL 300 MG/ML  SOLN
100.0000 mL | Freq: Once | INTRAMUSCULAR | Status: AC | PRN
  Administered 2019-03-17: 100 mL via INTRAVENOUS

## 2019-03-17 NOTE — Consult Note (Signed)
Pharmacy Antibiotic Note  Cassie Gaines is a 64 y.o. 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  Pharmacy has been consulted for Zosyn dosing. She is 7 days post-op robotic assisted laparoscopic right colectomy for unresectable dysplastic cecal polyp. Her renal function has been stable  Plan: Start Zosyn 3.375g IV q8h (4 hour infusion).  Height: 5\' 2"  (157.5 cm) Weight: 171 lb (77.6 kg) IBW/kg (Calculated) : 50.1  Temp (24hrs), Avg:98.5 F (36.9 C), Min:97.8 F (36.6 C), Max:99.3 F (37.4 C)  Recent Labs  Lab 03/12/19 0633 03/13/19 0812 03/14/19 0616 03/15/19 0618 03/17/19 0616  WBC 12.4* 13.7* 11.6* 8.1 6.9  CREATININE 0.93 0.90 0.93 0.84 0.82    Estimated Creatinine Clearance: 68.6 mL/min (by C-G formula based on SCr of 0.82 mg/dL).    Allergies  Allergen Reactions  . Erythromycin Hives  . Lisinopril Cough    Antimicrobials this admission: Zosyn 2/9 >>   Microbiology results: 1/29 SARS CoV-2: negative   Thank you for allowing pharmacy to be a part of this patient's care.  Dallie Piles 03/17/2019 7:46 AM

## 2019-03-17 NOTE — Progress Notes (Signed)
Patient complaining of burping, nausea and heartburn; NGT reconnected to LISx; will continue to monitor nausea and heartburn.  Cepacol lozenges ordered for sore throat d/t NGT; will offer Maalox as needed upon request.  HOB elevated at 45'; Barbaraann Faster, RN

## 2019-03-18 LAB — CBC WITH DIFFERENTIAL/PLATELET
Abs Immature Granulocytes: 0.04 10*3/uL (ref 0.00–0.07)
Basophils Absolute: 0 10*3/uL (ref 0.0–0.1)
Basophils Relative: 0 %
Eosinophils Absolute: 0.3 10*3/uL (ref 0.0–0.5)
Eosinophils Relative: 4 %
HCT: 36.4 % (ref 36.0–46.0)
Hemoglobin: 12 g/dL (ref 12.0–15.0)
Immature Granulocytes: 1 %
Lymphocytes Relative: 19 %
Lymphs Abs: 1.2 10*3/uL (ref 0.7–4.0)
MCH: 30.2 pg (ref 26.0–34.0)
MCHC: 33 g/dL (ref 30.0–36.0)
MCV: 91.7 fL (ref 80.0–100.0)
Monocytes Absolute: 0.8 10*3/uL (ref 0.1–1.0)
Monocytes Relative: 12 %
Neutro Abs: 4 10*3/uL (ref 1.7–7.7)
Neutrophils Relative %: 64 %
Platelets: 420 10*3/uL — ABNORMAL HIGH (ref 150–400)
RBC: 3.97 MIL/uL (ref 3.87–5.11)
RDW: 13.2 % (ref 11.5–15.5)
Smear Review: NORMAL
WBC: 6.3 10*3/uL (ref 4.0–10.5)
nRBC: 0 % (ref 0.0–0.2)

## 2019-03-18 LAB — BASIC METABOLIC PANEL
Anion gap: 9 (ref 5–15)
BUN: 7 mg/dL — ABNORMAL LOW (ref 8–23)
CO2: 30 mmol/L (ref 22–32)
Calcium: 8.5 mg/dL — ABNORMAL LOW (ref 8.9–10.3)
Chloride: 99 mmol/L (ref 98–111)
Creatinine, Ser: 1.1 mg/dL — ABNORMAL HIGH (ref 0.44–1.00)
GFR calc Af Amer: >60 mL/min (ref >60)
GFR calc non Af Amer: 54 mL/min — ABNORMAL LOW (ref >60)
Glucose, Bld: 116 mg/dL — ABNORMAL HIGH (ref 70–99)
Potassium: 3.9 mmol/L (ref 3.5–5.1)
Sodium: 138 mmol/L (ref 135–145)

## 2019-03-18 LAB — MAGNESIUM: Magnesium: 2.3 mg/dL (ref 1.7–2.4)

## 2019-03-18 LAB — PHOSPHORUS: Phosphorus: 3.8 mg/dL (ref 2.5–4.6)

## 2019-03-18 MED ORDER — METOCLOPRAMIDE HCL 5 MG/ML IJ SOLN
5.0000 mg | Freq: Three times a day (TID) | INTRAMUSCULAR | Status: DC
  Administered 2019-03-18 – 2019-03-31 (×39): 5 mg via INTRAVENOUS
  Filled 2019-03-18 (×39): qty 2

## 2019-03-18 NOTE — Progress Notes (Signed)
Subjective:  CC: Cassie Gaines is a 64 y.o. female  Hospital stay day 8, 8 Days Post-Op robotic assisted laparoscopic right colectomy for unresectable dysplastic cecal polyp  HPI: Doing much better again today. After staying on suction overnight.  "best I have felt" since admission  ROS:  General: Denies weight loss, weight gain, fatigue, fevers, chills, and night sweats. Heart: Denies chest pain, palpitations, racing heart, irregular heartbeat, leg pain or swelling, and decreased activity tolerance. Respiratory: Denies breathing difficulty, shortness of breath, wheezing, cough, and sputum. GI: Denies change in appetite, constipation, diarrhea, and blood in stool. GU: Denies difficulty urinating, pain with urinating, urgency, frequency, blood in urine.   Objective:   Temp:  [98.7 F (37.1 C)-98.8 F (37.1 C)] 98.7 F (37.1 C) (02/10 0443) Pulse Rate:  [77-78] 78 (02/10 0443) Resp:  [16-20] 20 (02/10 0443) BP: (105-146)/(86-93) 105/92 (02/10 0443) SpO2:  [97 %-98 %] 98 % (02/10 0443)     Height: 5\' 2"  (157.5 cm) Weight: 77.6 kg BMI (Calculated): 31.27   Intake/Output this shift:   Intake/Output Summary (Last 24 hours) at 03/18/2019 0911 Last data filed at 03/17/2019 2200 Gross per 24 hour  Intake 2486.21 ml  Output 2365 ml  Net 121.21 ml    Constitutional :  alert, cooperative, appears stated age and no distress  Respiratory:  clear to auscultation bilaterally  Cardiovascular:  regular rate and rhythm  Gastrointestinal: still soft, no TTP, no guarding at all even with deep palpation.  thin serous output noted from JP.  incisions c/d/i. NG with 81ml/24hrs  Skin: Cool and moist.  Incisions clean dry and intact.  JP with some old dark serous fluid.  Psychiatric: Normal affect, non-agitated, not confused       LABS:  CMP Latest Ref Rng & Units 03/18/2019 03/17/2019 03/15/2019  Glucose 70 - 99 mg/dL 116(H) 120(H) 125(H)  BUN 8 - 23 mg/dL 7(L) 7(L) 7(L)  Creatinine 0.44 - 1.00 mg/dL  1.10(H) 0.82 0.84  Sodium 135 - 145 mmol/L 138 137 139  Potassium 3.5 - 5.1 mmol/L 3.9 3.9 3.6  Chloride 98 - 111 mmol/L 99 101 101  CO2 22 - 32 mmol/L 30 27 28   Calcium 8.9 - 10.3 mg/dL 8.5(L) 8.4(L) 8.4(L)   CBC Latest Ref Rng & Units 03/18/2019 03/17/2019 03/15/2019  WBC 4.0 - 10.5 K/uL 6.3 6.9 8.1  Hemoglobin 12.0 - 15.0 g/dL 12.0 12.2 12.8  Hematocrit 36.0 - 46.0 % 36.4 38.0 38.5  Platelets 150 - 400 K/uL 420(H) 379 366    RADS: CLINICAL DATA:  64 year old female with a history right colectomy, and known fluid and gas collection at the anastomosis, concern for leak  EXAM: CT ABDOMEN AND PELVIS WITH CONTRAST  TECHNIQUE: Multidetector CT imaging of the abdomen and pelvis was performed using the standard protocol following bolus administration of intravenous contrast.  CONTRAST:  120mL OMNIPAQUE IOHEXOL 300 MG/ML  SOLN  COMPARISON:  03/15/2019  FINDINGS: Lower chest: Atelectasis/scarring at the lung bases.  Hepatobiliary: Unremarkable liver.  Unremarkable gallbladder.  Pancreas: Unremarkable pancreas  Spleen: Unremarkable  Adrenals/Urinary Tract:  - Right adrenal gland:  Unremarkable  - Left adrenal gland: Unremarkable.  - Right kidney: No hydronephrosis, nephrolithiasis, inflammation, or ureteral dilation. No focal lesion.  - Left Kidney: No hydronephrosis, nephrolithiasis, inflammation, or ureteral dilation. No focal lesion.  - Urinary Bladder: Urinary bladder relatively decompressed.  Stomach/Bowel:  - Stomach: Gastric tube terminates within the stomach lumen. Stomach is relatively decompressed.  - Small bowel: Enteric contrast has traversed  the small bowel, and reaches the anastomosis. Contrast has extended through the colon to the rectum.  Surgical changes of right partial colectomy and anastomosis. The coronal reformatted images demonstrate a linear hyperdensity extending from the medial margin of the anastomosis, in a  crescent configuration, from the series imaging sequence 36-40 of series 5. This is at the site of persisting fluid and gas collection. This crescentic hyperdensity does not appear to represent a suture line.  Persisting dilated small bowel loops.  - Colon: Colon is relatively decompressed. Contrast reaches distal colon. Diverticular disease.  Surgical drain remains in place from a left lower abdominal approach, terminating in the right abdomen.  Persisting free air within the abdomen, which overall appears decreased in volume compared to the prior CT. Overall the fluid and gas collection has not increased significantly in size, with the largest diameter 48 mm, essentially the same as the prior 5 cm.  Vascular/Lymphatic: No significant atherosclerosis. Unremarkable venous structures.  No adenopathy.  Reproductive: Fibroid uterus.  Unremarkable adnexa.  Other: Surgical changes of the left abdominal wall, similar to the prior.  Musculoskeletal: No displaced fracture.  No bony canal narrowing.  IMPRESSION: The CT confirms a small anastomotic leak at the site of partial right colectomy, however, the adjacent fluid and gas collection, presumably phlegmon/abscess, has not increased in size from the comparison CT, and perhaps is overall slightly smaller. Similar to slightly decreased volume of free intraperitoneal air within the abdomen.  Evidence of ongoing ileus, with no sign of obstruction as enteric contrast has traversed the colon and reaches the rectum.  Additional ancillary findings as above.   Electronically Signed   By: Corrie Mckusick D.O.   On: 03/17/2019 16:39  Assessment:   S/p robotic assisted laparoscopic right colectomy for unresectable 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  CT concerns for contained anastamosis leak, but clinically doing well again with minium symptoms while NG on suction and NPO.  CT now confirms anastomotic leak, which has been a  suspicion all along.  He remains clinically stable with minimal pain or evidence of peritonitis.  Despite this, she remains unable to tolerate clear liquids, with NG output persistently high after each clamp trial.  Reoperation at this time carry significant risks due number of days postop from initial surgery.  The leak also seems very minimal and well contained, to the point where a redo operation can potentially be more morbid than waiting for another few days to see if her nausea resolves.  Patient still will prefer to avoid surgery at all cost at this point.  NG tube noted to be beyond the pylorus on CT, so we will pulled it back a few centimeters and also started on Reglan to see if we can facilitate gastric emptying to minimize nausea.  We will potentially try clamp trial again tomorrow, and if that fails will probably need to strongly consider surgical intervention at that time.  Plan updated with patient and she is agreeable.  He has remained in good spirits throughout this entire stay.

## 2019-03-18 NOTE — Progress Notes (Signed)
Subjective:  CC: Cassie Gaines is a 64 y.o. female  Hospital stay day 7, 7days robotic assisted laparoscopic right colectomy for unresectable dysplastic cecal polyp  HPI: Had another episode of emesis.  ROS:  General: Denies weight loss, weight gain, fatigue, fevers, chills, and night sweats. Heart: Denies chest pain, palpitations, racing heart, irregular heartbeat, leg pain or swelling, and decreased activity tolerance. Respiratory: Denies breathing difficulty, shortness of breath, wheezing, cough, and sputum. GI: Denies change in appetite, constipation, diarrhea, and blood in stool. GU: Denies difficulty urinating, pain with urinating, urgency, frequency, blood in urine.   Objective:   Temp:  [98.7 F (37.1 C)-98.8 F (37.1 C)] 98.7 F (37.1 C) (02/10 0443) Pulse Rate:  [77-78] 78 (02/10 0443) Resp:  [16-20] 20 (02/10 0443) BP: (105-146)/(86-93) 105/92 (02/10 0443) SpO2:  [97 %-98 %] 98 % (02/10 0443)     Height: 5\' 2"  (157.5 cm) Weight: 77.6 kg BMI (Calculated): 31.27   Intake/Output this shift:   Intake/Output Summary (Last 24 hours) at 03/18/2019 [id number] Last data filed at 03/17/2019 2200 Gross per 24 hour  Intake 2486.21 ml  Output 2365 ml  Net 121.21 ml    Constitutional :  alert, cooperative, appears stated age and no distress  Respiratory:  clear to auscultation bilaterally  Cardiovascular:  regular rate and rhythm  Gastrointestinal: still soft, no TTP, no guarding at all even with deep palpation.  thin serous output noted from JP.  incisions c/d/i.Marland Kitchen   Skin: Cool and moist.  Incisions clean dry and intact.  JP with some old dark serous fluid.  Psychiatric: Normal affect, non-agitated, not confused       LABS:  CMP Latest Ref Rng & Units 03/18/2019 03/17/2019 03/15/2019  Glucose 70 - 99 mg/dL 116(H) 120(H) 125(H)  BUN 8 - 23 mg/dL 7(L) 7(L) 7(L)  Creatinine 0.44 - 1.00 mg/dL 1.10(H) 0.82 0.84  Sodium 135 - 145 mmol/L 138 137 139  Potassium 3.5 - 5.1 mmol/L 3.9 3.9 3.6   Chloride 98 - 111 mmol/L 99 101 101  CO2 22 - 32 mmol/L 30 27 28   Calcium 8.9 - 10.3 mg/dL 8.5(L) 8.4(L) 8.4(L)   CBC Latest Ref Rng & Units 03/18/2019 03/17/2019 03/15/2019  WBC 4.0 - 10.5 K/uL 6.3 6.9 8.1  Hemoglobin 12.0 - 15.0 g/dL 12.0 12.2 12.8  Hematocrit 36.0 - 46.0 % 36.4 38.0 38.5  Platelets 150 - 400 K/uL 420(H) 379 366    RADS:  Assessment:   S/p robotic assisted laparoscopic right colectomy for unresectable 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  CT concerns for contained anastamosis leak, but clinically doing well again with minium symptoms while NG on suction and NPO.  Failed clears twice, re-ordered CT scan to see any change.  NPO for now.

## 2019-03-19 ENCOUNTER — Encounter: Payer: Self-pay | Admitting: Anesthesiology

## 2019-03-19 LAB — BASIC METABOLIC PANEL
Anion gap: 10 (ref 5–15)
BUN: 11 mg/dL (ref 8–23)
CO2: 33 mmol/L — ABNORMAL HIGH (ref 22–32)
Calcium: 8.4 mg/dL — ABNORMAL LOW (ref 8.9–10.3)
Chloride: 98 mmol/L (ref 98–111)
Creatinine, Ser: 1.44 mg/dL — ABNORMAL HIGH (ref 0.44–1.00)
GFR calc Af Amer: 45 mL/min — ABNORMAL LOW (ref >60)
GFR calc non Af Amer: 39 mL/min — ABNORMAL LOW (ref >60)
Glucose, Bld: 128 mg/dL — ABNORMAL HIGH (ref 70–99)
Potassium: 3.7 mmol/L (ref 3.5–5.1)
Sodium: 141 mmol/L (ref 135–145)

## 2019-03-19 LAB — CBC WITH DIFFERENTIAL/PLATELET
Abs Immature Granulocytes: 0.05 10*3/uL (ref 0.00–0.07)
Basophils Absolute: 0 10*3/uL (ref 0.0–0.1)
Basophils Relative: 1 %
Eosinophils Absolute: 0.2 10*3/uL (ref 0.0–0.5)
Eosinophils Relative: 4 %
HCT: 37.2 % (ref 36.0–46.0)
Hemoglobin: 12 g/dL (ref 12.0–15.0)
Immature Granulocytes: 1 %
Lymphocytes Relative: 20 %
Lymphs Abs: 1.3 10*3/uL (ref 0.7–4.0)
MCH: 30.3 pg (ref 26.0–34.0)
MCHC: 32.3 g/dL (ref 30.0–36.0)
MCV: 93.9 fL (ref 80.0–100.0)
Monocytes Absolute: 1 10*3/uL (ref 0.1–1.0)
Monocytes Relative: 15 %
Neutro Abs: 3.9 10*3/uL (ref 1.7–7.7)
Neutrophils Relative %: 59 %
Platelets: 447 10*3/uL — ABNORMAL HIGH (ref 150–400)
RBC: 3.96 MIL/uL (ref 3.87–5.11)
RDW: 13.3 % (ref 11.5–15.5)
Smear Review: NORMAL
WBC: 6.5 10*3/uL (ref 4.0–10.5)
nRBC: 0 % (ref 0.0–0.2)

## 2019-03-19 LAB — PHOSPHORUS: Phosphorus: 4.2 mg/dL (ref 2.5–4.6)

## 2019-03-19 LAB — MAGNESIUM: Magnesium: 2.3 mg/dL (ref 1.7–2.4)

## 2019-03-19 NOTE — Progress Notes (Signed)
Subjective:  CC: Cassie Gaines is a 64 y.o. female  Hospital stay day 9, 9 Days Post-Op robotic assisted laparoscopic right colectomy for unresectable dysplastic cecal polyp  HPI: Doing much better again today.  Tolerated clamp trial this a.m. and has had few drinks and broth.   ROS:  General: Denies weight loss, weight gain, fatigue, fevers, chills, and night sweats. Heart: Denies chest pain, palpitations, racing heart, irregular heartbeat, leg pain or swelling, and decreased activity tolerance. Respiratory: Denies breathing difficulty, shortness of breath, wheezing, cough, and sputum. GI: Denies change in appetite, constipation, diarrhea, and blood in stool. GU: Denies difficulty urinating, pain with urinating, urgency, frequency, blood in urine.   Objective:   Temp:  [98 F (36.7 C)-98.6 F (37 C)] 98 F (36.7 C) (02/11 1352) Pulse Rate:  [76-85] 76 (02/11 1352) Resp:  [20] 20 (02/11 0440) BP: (109-124)/(62-78) 112/62 (02/11 1352) SpO2:  [95 %-97 %] 96 % (02/11 1352)     Height: 5\' 2"  (157.5 cm) Weight: 77.6 kg BMI (Calculated): 31.27   Intake/Output this shift:   Intake/Output Summary (Last 24 hours) at 03/19/2019 1531 Last data filed at 03/19/2019 1233 Gross per 24 hour  Intake 0 ml  Output 1085 ml  Net -1085 ml    Constitutional :  alert, cooperative, appears stated age and no distress  Respiratory:  clear to auscultation bilaterally  Cardiovascular:  regular rate and rhythm  Gastrointestinal: still soft, no TTP, no guarding at all even with deep palpation.  thin serous output noted from JP.  incisions c/d/i.   Skin: Cool and moist.  Incisions clean dry and intact.  JP with light serous fluid.  Psychiatric: Normal affect, non-agitated, not confused       LABS:  CMP Latest Ref Rng & Units 03/19/2019 03/18/2019 03/17/2019  Glucose 70 - 99 mg/dL 128(H) 116(H) 120(H)  BUN 8 - 23 mg/dL 11 7(L) 7(L)  Creatinine 0.44 - 1.00 mg/dL 1.44(H) 1.10(H) 0.82  Sodium 135 - 145 mmol/L  141 138 137  Potassium 3.5 - 5.1 mmol/L 3.7 3.9 3.9  Chloride 98 - 111 mmol/L 98 99 101  CO2 22 - 32 mmol/L 33(H) 30 27  Calcium 8.9 - 10.3 mg/dL 8.4(L) 8.5(L) 8.4(L)   CBC Latest Ref Rng & Units 03/19/2019 03/18/2019 03/17/2019  WBC 4.0 - 10.5 K/uL 6.5 6.3 6.9  Hemoglobin 12.0 - 15.0 g/dL 12.0 12.0 12.2  Hematocrit 36.0 - 46.0 % 37.2 36.4 38.0  Platelets 150 - 400 K/uL 447(H) 420(H) 379    RADS: CLINICAL DATA:  64 year old female with a history right colectomy, and known fluid and gas collection at the anastomosis, concern for leak  EXAM: CT ABDOMEN AND PELVIS WITH CONTRAST  TECHNIQUE: Multidetector CT imaging of the abdomen and pelvis was performed using the standard protocol following bolus administration of intravenous contrast.  CONTRAST:  164mL OMNIPAQUE IOHEXOL 300 MG/ML  SOLN  COMPARISON:  03/15/2019  FINDINGS: Lower chest: Atelectasis/scarring at the lung bases.  Hepatobiliary: Unremarkable liver.  Unremarkable gallbladder.  Pancreas: Unremarkable pancreas  Spleen: Unremarkable  Adrenals/Urinary Tract:  - Right adrenal gland:  Unremarkable  - Left adrenal gland: Unremarkable.  - Right kidney: No hydronephrosis, nephrolithiasis, inflammation, or ureteral dilation. No focal lesion.  - Left Kidney: No hydronephrosis, nephrolithiasis, inflammation, or ureteral dilation. No focal lesion.  - Urinary Bladder: Urinary bladder relatively decompressed.  Stomach/Bowel:  - Stomach: Gastric tube terminates within the stomach lumen. Stomach is relatively decompressed.  - Small bowel: Enteric contrast has traversed the small  bowel, and reaches the anastomosis. Contrast has extended through the colon to the rectum.  Surgical changes of right partial colectomy and anastomosis. The coronal reformatted images demonstrate a linear hyperdensity extending from the medial margin of the anastomosis, in a crescent configuration, from the series imaging  sequence 36-40 of series 5. This is at the site of persisting fluid and gas collection. This crescentic hyperdensity does not appear to represent a suture line.  Persisting dilated small bowel loops.  - Colon: Colon is relatively decompressed. Contrast reaches distal colon. Diverticular disease.  Surgical drain remains in place from a left lower abdominal approach, terminating in the right abdomen.  Persisting free air within the abdomen, which overall appears decreased in volume compared to the prior CT. Overall the fluid and gas collection has not increased significantly in size, with the largest diameter 48 mm, essentially the same as the prior 5 cm.  Vascular/Lymphatic: No significant atherosclerosis. Unremarkable venous structures.  No adenopathy.  Reproductive: Fibroid uterus.  Unremarkable adnexa.  Other: Surgical changes of the left abdominal wall, similar to the prior.  Musculoskeletal: No displaced fracture.  No bony canal narrowing.  IMPRESSION: The CT confirms a small anastomotic leak at the site of partial right colectomy, however, the adjacent fluid and gas collection, presumably phlegmon/abscess, has not increased in size from the comparison CT, and perhaps is overall slightly smaller. Similar to slightly decreased volume of free intraperitoneal air within the abdomen.  Evidence of ongoing ileus, with no sign of obstruction as enteric contrast has traversed the colon and reaches the rectum.  Additional ancillary findings as above.   Electronically Signed   By: Corrie Mckusick D.O.   On: 03/17/2019 16:39  Assessment:   S/p robotic assisted laparoscopic right colectomy for unresectable 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  CT concerns for contained anastamosis leak, but clinically doing well again with minium symptoms after a small trial of a clear liquid diet.  NG tube placed back on suction after 3 hours of being clamped.  Only 50 mL of what looks  like what she is currently drinking came out.  Ileus may be resolving.  However, patient had a similar presentation earlier in the week but still ended up having persistent ileus.  We will continue to be conservative and keep the NG clamped but still in place.  Asked patient if she would prefer to try a full liquid diet at this time but she declined, so we will continue with clear liquid diet for the rest of the night.

## 2019-03-20 ENCOUNTER — Encounter
Admission: RE | Disposition: A | Discharge status: Home / Self Care | Payer: Self-pay | Source: Home / Self Care | Attending: Surgery

## 2019-03-20 ENCOUNTER — Inpatient Hospital Stay: Payer: Self-pay

## 2019-03-20 LAB — BASIC METABOLIC PANEL
Anion gap: 10 (ref 5–15)
BUN: 10 mg/dL (ref 8–23)
CO2: 32 mmol/L (ref 22–32)
Calcium: 8.6 mg/dL — ABNORMAL LOW (ref 8.9–10.3)
Chloride: 97 mmol/L — ABNORMAL LOW (ref 98–111)
Creatinine, Ser: 1.42 mg/dL — ABNORMAL HIGH (ref 0.44–1.00)
GFR calc Af Amer: 46 mL/min — ABNORMAL LOW (ref >60)
GFR calc non Af Amer: 39 mL/min — ABNORMAL LOW (ref >60)
Glucose, Bld: 126 mg/dL — ABNORMAL HIGH (ref 70–99)
Potassium: 3.8 mmol/L (ref 3.5–5.1)
Sodium: 139 mmol/L (ref 135–145)

## 2019-03-20 LAB — CBC WITH DIFFERENTIAL/PLATELET
Abs Immature Granulocytes: 0.03 10*3/uL (ref 0.00–0.07)
Basophils Absolute: 0 10*3/uL (ref 0.0–0.1)
Basophils Relative: 0 %
Eosinophils Absolute: 0.3 10*3/uL (ref 0.0–0.5)
Eosinophils Relative: 3 %
HCT: 37.9 % (ref 36.0–46.0)
Hemoglobin: 12.2 g/dL (ref 12.0–15.0)
Immature Granulocytes: 0 %
Lymphocytes Relative: 17 %
Lymphs Abs: 1.6 10*3/uL (ref 0.7–4.0)
MCH: 30.3 pg (ref 26.0–34.0)
MCHC: 32.2 g/dL (ref 30.0–36.0)
MCV: 94 fL (ref 80.0–100.0)
Monocytes Absolute: 1 10*3/uL (ref 0.1–1.0)
Monocytes Relative: 11 %
Neutro Abs: 6.4 10*3/uL (ref 1.7–7.7)
Neutrophils Relative %: 69 %
Platelets: 486 10*3/uL — ABNORMAL HIGH (ref 150–400)
RBC: 4.03 MIL/uL (ref 3.87–5.11)
RDW: 13.2 % (ref 11.5–15.5)
WBC: 9.3 10*3/uL (ref 4.0–10.5)
nRBC: 0 % (ref 0.0–0.2)

## 2019-03-20 LAB — GLUCOSE, CAPILLARY: Glucose-Capillary: 118 mg/dL — ABNORMAL HIGH (ref 70–99)

## 2019-03-20 LAB — MAGNESIUM: Magnesium: 2.3 mg/dL (ref 1.7–2.4)

## 2019-03-20 LAB — PHOSPHORUS: Phosphorus: 4 mg/dL (ref 2.5–4.6)

## 2019-03-20 SURGERY — LAPAROTOMY, EXPLORATORY
Anesthesia: General

## 2019-03-20 MED ORDER — SODIUM CHLORIDE 0.9% FLUSH
10.0000 mL | INTRAVENOUS | Status: DC | PRN
  Administered 2019-03-25: 10 mL

## 2019-03-20 MED ORDER — FAT EMULSION PLANT BASED 20 % IV EMUL
250.0000 mL | INTRAVENOUS | Status: AC
  Administered 2019-03-20: 19:00:00 250 mL via INTRAVENOUS
  Filled 2019-03-20: qty 250

## 2019-03-20 MED ORDER — SODIUM CHLORIDE 0.9% FLUSH
10.0000 mL | Freq: Two times a day (BID) | INTRAVENOUS | Status: DC
  Administered 2019-03-20 – 2019-03-25 (×7): 10 mL
  Administered 2019-03-25: 09:00:00 20 mL
  Administered 2019-03-26 – 2019-03-31 (×11): 10 mL

## 2019-03-20 MED ORDER — ONDANSETRON HCL 4 MG/2ML IJ SOLN
4.0000 mg | Freq: Four times a day (QID) | INTRAMUSCULAR | Status: DC | PRN
  Administered 2019-03-23: 03:00:00 4 mg via INTRAVENOUS
  Filled 2019-03-20: qty 2

## 2019-03-20 MED ORDER — INSULIN ASPART 100 UNIT/ML ~~LOC~~ SOLN: 0.0000 [IU] | SUBCUTANEOUS | Status: DC

## 2019-03-20 MED ORDER — TRACE MINERALS CU-MN-SE-ZN 300-55-60-3000 MCG/ML IV SOLN
INTRAVENOUS | Status: AC
  Filled 2019-03-20: qty 960

## 2019-03-20 MED ORDER — CHLORHEXIDINE GLUCONATE CLOTH 2 % EX PADS
6.0000 | MEDICATED_PAD | Freq: Every day | CUTANEOUS | Status: DC
  Administered 2019-03-22 – 2019-03-30 (×9): 6 via TOPICAL

## 2019-03-20 SURGICAL SUPPLY — 29 items
CANISTER SUCT 1200ML W/VALVE (MISCELLANEOUS) ×3 IMPLANT
CHLORAPREP W/TINT 26 (MISCELLANEOUS) ×3 IMPLANT
COVER WAND RF STERILE (DRAPES) ×3 IMPLANT
DRAPE LAPAROTOMY 100X77 ABD (DRAPES) ×3 IMPLANT
DRSG OPSITE POSTOP 4X10 (GAUZE/BANDAGES/DRESSINGS) ×3 IMPLANT
DRSG OPSITE POSTOP 4X8 (GAUZE/BANDAGES/DRESSINGS) ×3 IMPLANT
DRSG TEGADERM 4X10 (GAUZE/BANDAGES/DRESSINGS) ×3 IMPLANT
DRSG TELFA 3X8 NADH (GAUZE/BANDAGES/DRESSINGS) ×3 IMPLANT
ELECT REM PT RETURN 9FT ADLT (ELECTROSURGICAL) ×3
ELECTRODE REM PT RTRN 9FT ADLT (ELECTROSURGICAL) ×1 IMPLANT
GLOVE BIOGEL PI IND STRL 7.0 (GLOVE) ×1 IMPLANT
GLOVE BIOGEL PI INDICATOR 7.0 (GLOVE) ×2
GLOVE SURG SYN 6.5 ES PF (GLOVE) ×3 IMPLANT
GOWN STRL REUS W/ TWL LRG LVL3 (GOWN DISPOSABLE) ×2 IMPLANT
GOWN STRL REUS W/TWL LRG LVL3 (GOWN DISPOSABLE) ×4
KIT TURNOVER KIT A (KITS) ×3 IMPLANT
LABEL OR SOLS (LABEL) ×3 IMPLANT
NS IRRIG 1000ML POUR BTL (IV SOLUTION) ×3 IMPLANT
PACK BASIN MAJOR ARMC (MISCELLANEOUS) ×3 IMPLANT
PACK COLON CLEAN CLOSURE (MISCELLANEOUS) ×3 IMPLANT
SPONGE LAP 18X18 RF (DISPOSABLE) ×3 IMPLANT
SUT PDS AB 1 TP1 54 (SUTURE) ×6 IMPLANT
SUT SILK 2 0 (SUTURE) ×2
SUT SILK 2-0 18XBRD TIE 12 (SUTURE) ×1 IMPLANT
SUT SILK 3 0 (SUTURE) ×2
SUT SILK 3-0 18XBRD TIE 12 (SUTURE) ×1 IMPLANT
SUT VIC AB 3-0 SH 27 (SUTURE) ×4
SUT VIC AB 3-0 SH 27X BRD (SUTURE) ×2 IMPLANT
TRAY FOLEY MTR SLVR 16FR STAT (SET/KITS/TRAYS/PACK) ×3 IMPLANT

## 2019-03-20 NOTE — Progress Notes (Signed)
Subjective:  CC: Cassie Gaines is a 64 y.o. female  Hospital stay day 10, 10 Days Post-Op robotic assisted laparoscopic right colectomy for unresectable dysplastic cecal polyp  HPI: Had an episode of emesis again while attempting a clear liquid diet.  She otherwise is doing well with no complaints.  ROS:  General: Denies weight loss, weight gain, fatigue, fevers, chills, and night sweats. Heart: Denies chest pain, palpitations, racing heart, irregular heartbeat, leg pain or swelling, and decreased activity tolerance. Respiratory: Denies breathing difficulty, shortness of breath, wheezing, cough, and sputum. GI: Denies change in appetite, constipation, diarrhea, and blood in stool. GU: Denies difficulty urinating, pain with urinating, urgency, frequency, blood in urine.   Objective:   Temp:  [98 F (36.7 C)-98.4 F (36.9 C)] 98.4 F (36.9 C) (02/12 0449) Pulse Rate:  [76-85] 78 (02/12 0449) Resp:  [20] 20 (02/11 2023) BP: (111-120)/(62-78) 120/77 (02/12 0449) SpO2:  [96 %-100 %] 97 % (02/12 0449)     Height: 5\' 2"  (157.5 cm) Weight: 77.6 kg BMI (Calculated): 31.27   Intake/Output this shift:   Intake/Output Summary (Last 24 hours) at 03/20/2019 1025 Last data filed at 03/20/2019 [id number] Gross per 24 hour  Intake 4078.1 ml  Output 1397 ml  Net 2681.1 ml    Constitutional :  alert, cooperative, appears stated age and no distress  Respiratory:  clear to auscultation bilaterally  Cardiovascular:  regular rate and rhythm  Gastrointestinal: still soft, no TTP, no guarding at all even with deep palpation.  thin serous output noted from JP.  incisions c/d/i.   Skin: Cool and moist.  Incisions clean dry and intact.  JP with light serous fluid.  Psychiatric: Normal affect, non-agitated, not confused       LABS:  CMP Latest Ref Rng & Units 03/20/2019 03/19/2019 03/18/2019  Glucose 70 - 99 mg/dL 126(H) 128(H) 116(H)  BUN 8 - 23 mg/dL 10 11 7(L)  Creatinine 0.44 - 1.00 mg/dL 1.42(H) 1.44(H)  1.10(H)  Sodium 135 - 145 mmol/L 139 141 138  Potassium 3.5 - 5.1 mmol/L 3.8 3.7 3.9  Chloride 98 - 111 mmol/L 97(L) 98 99  CO2 22 - 32 mmol/L 32 33(H) 30  Calcium 8.9 - 10.3 mg/dL 8.6(L) 8.4(L) 8.5(L)   CBC Latest Ref Rng & Units 03/20/2019 03/19/2019 03/18/2019  WBC 4.0 - 10.5 K/uL 9.3 6.5 6.3  Hemoglobin 12.0 - 15.0 g/dL 12.2 12.0 12.0  Hematocrit 36.0 - 46.0 % 37.9 37.2 36.4  Platelets 150 - 400 K/uL 486(H) 447(H) 420(H)    RADS: CLINICAL DATA:  64 year old female with a history right colectomy, and known fluid and gas collection at the anastomosis, concern for leak  EXAM: CT ABDOMEN AND PELVIS WITH CONTRAST  TECHNIQUE: Multidetector CT imaging of the abdomen and pelvis was performed using the standard protocol following bolus administration of intravenous contrast.  CONTRAST:  170mL OMNIPAQUE IOHEXOL 300 MG/ML  SOLN  COMPARISON:  03/15/2019  FINDINGS: Lower chest: Atelectasis/scarring at the lung bases.  Hepatobiliary: Unremarkable liver.  Unremarkable gallbladder.  Pancreas: Unremarkable pancreas  Spleen: Unremarkable  Adrenals/Urinary Tract:  - Right adrenal gland:  Unremarkable  - Left adrenal gland: Unremarkable.  - Right kidney: No hydronephrosis, nephrolithiasis, inflammation, or ureteral dilation. No focal lesion.  - Left Kidney: No hydronephrosis, nephrolithiasis, inflammation, or ureteral dilation. No focal lesion.  - Urinary Bladder: Urinary bladder relatively decompressed.  Stomach/Bowel:  - Stomach: Gastric tube terminates within the stomach lumen. Stomach is relatively decompressed.  - Small bowel: Enteric contrast has traversed  the small bowel, and reaches the anastomosis. Contrast has extended through the colon to the rectum.  Surgical changes of right partial colectomy and anastomosis. The coronal reformatted images demonstrate a linear hyperdensity extending from the medial margin of the anastomosis, in a  crescent configuration, from the series imaging sequence 36-40 of series 5. This is at the site of persisting fluid and gas collection. This crescentic hyperdensity does not appear to represent a suture line.  Persisting dilated small bowel loops.  - Colon: Colon is relatively decompressed. Contrast reaches distal colon. Diverticular disease.  Surgical drain remains in place from a left lower abdominal approach, terminating in the right abdomen.  Persisting free air within the abdomen, which overall appears decreased in volume compared to the prior CT. Overall the fluid and gas collection has not increased significantly in size, with the largest diameter 48 mm, essentially the same as the prior 5 cm.  Vascular/Lymphatic: No significant atherosclerosis. Unremarkable venous structures.  No adenopathy.  Reproductive: Fibroid uterus.  Unremarkable adnexa.  Other: Surgical changes of the left abdominal wall, similar to the prior.  Musculoskeletal: No displaced fracture.  No bony canal narrowing.  IMPRESSION: The CT confirms a small anastomotic leak at the site of partial right colectomy, however, the adjacent fluid and gas collection, presumably phlegmon/abscess, has not increased in size from the comparison CT, and perhaps is overall slightly smaller. Similar to slightly decreased volume of free intraperitoneal air within the abdomen.  Evidence of ongoing ileus, with no sign of obstruction as enteric contrast has traversed the colon and reaches the rectum.  Additional ancillary findings as above.   Electronically Signed   By: Corrie Mckusick D.O.   On: 03/17/2019 16:39  Assessment:   S/p robotic assisted laparoscopic right colectomy for unresectable 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  CT concerns for contained anastamosis leak.  Patient now has failed a p.o. trial with clear liquids multiple times.  This is despite use of antibiotics, scheduled Reglan, and  multiple NG clamp trials that showed minimal residual after multiple checks.  The amount of emesis typically is around a liter at a time when she fails her clear liquids, so I believe she does have very slow transit of contents that causes her to not be able to tolerate a diet.  I do not believe there is an obstruction or a complete ileus due to the fact that she has had multiple imaging studies confirming easy passage of contrast through the anastomosis, as well as reported bowel movements almost on a daily basis.  I explained to her that with the multiple failed p.o. trials despite interventions above, no other obvious causes of this except for the known anastomotic leak.  I initially recommended returning to the operating room for a redo anastomosis, but the patient was hesitant on pursuing any surgical intervention at this time.  Alternatives include treating the anastomotic leak as a small contained fistula, and placing her on full n.p.o. status for an extended period with TPN.  I did specifically explained to her this will result in a prolonged hospitalization stay due to the fact she still needs continuous NG decompression to prevent persistent nausea and emesis.    Patient verbalized understanding and she still would like to pursue the TPN route to see if this will allow the leak to resolve and also hopefully allow her p.o. intolerance to resolve as well.  TPN and PICC line placement orders placed.  At this point with persistent normal leukocytosis and no signs  of worsening clinically, as well as no significant improvement in clinical exam, we will discontinue the antibiotics.  We will continue Reglan for now since she seems to be tolerating it well.  Since clamp trials and imaging studies have not been a reliable indicator of her clinical progression, will continue NG tube decompression to see if there is a persistently low output as a measure of possible resolution of her p.o. intolerance, prior to  actually initiating another p.o. trial.  All questions addressed at this time.

## 2019-03-20 NOTE — Progress Notes (Signed)
Pt had one episode of emisis of 900 ml. Given IV phenergan. Primary nurse notified Dr. Windell Moment. Orders to resume NG tube to LIS. Primary nurse to continue to monitor.

## 2019-03-20 NOTE — Progress Notes (Signed)
Nutrition Follow-up  DOCUMENTATION CODES:   Obesity unspecified  INTERVENTION:  Once PICC placed and confirmed initiate Clinimix E 5/15 at 40 mL/hr over 24 hrs + 250 mL of 20% ILE (lipids) over 12 hrs daily.   After 24 hours if electrolytes, CBGs, and triglycerides are within acceptable range advance to goal TPN regimen of Clinimix E 5/15 at 83 mL/hr over 24 hrs + 250 mL of 20% ILE (lipids) over 12 hrs daily. Goal regimen provides 1914 kcal, 100 grams of protein, 1992 mL from Clinimix daily.  Provide adult MVI in TPN per guidelines in setting of shortage. Provide trace elements daily in TPN.  Monitor magnesium, potassium, and phosphorus daily for at least 3 days, MD to replete as needed, as pt is at risk for refeeding syndrome given prolonged NPO status.  NUTRITION DIAGNOSIS:   Inadequate oral intake related to acute illness as evidenced by NPO status.  Ongoing - addressing with initiation of TPN today.  GOAL:   Patient will meet greater than or equal to 90% of their needs  Progressing with initiation of TPN.  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 Met with patient at bedside. She is now post-op day 10. She was started on CLD on 2/11 but is now NPO again. She has NGT to suction and there was approximately 900 mL of output present in cannister at time of RD assessment. She reports she has been passing flatus and had a BM yesterday. Her last meal was on 1/31 so she is now on day 12 of NPO/CLD. Patient reports she has discussed TPN with Surgeon and she has no questions about TPN at this time.  Medications reviewed and include: Reglan 5 mg Q8hrs IV.  Labs reviewed: Chloride 97, Creatinine 1.42.  Potassium, Magnesium, and Phosphorus are WNL.  IV Access: PICC RN came into room to begin placement of PICC after RD follow-up assessment this morning  NUTRITION - FOCUSED PHYSICAL EXAM:    Most Recent Value  Orbital Region  No depletion  Upper Arm Region  No depletion  Thoracic and Lumbar Region  No depletion  Buccal Region  No depletion  Temple Region  No depletion  Clavicle Bone Region  No depletion  Clavicle and Acromion Bone Region  No depletion  Scapular Bone Region  No depletion  Dorsal Hand  No depletion  Patellar Region  No depletion  Anterior Thigh Region  No depletion  Posterior Calf Region  No depletion  Edema (RD Assessment)  None  Hair  Reviewed  Eyes  Reviewed  Mouth  Reviewed  Skin  Reviewed  Nails  Reviewed     Diet Order:   Diet Order            Diet NPO time specified Except for: Sips with Meds, Ice Chips  Diet effective midnight             EDUCATION NEEDS:   No education needs have been identified at this time  Skin:  Skin Assessment: Skin Integrity Issues:(closed incision to abdomen)  Last BM:  03/19/2019 small type 6  Height:   Ht Readings from Last 1 Encounters:  03/11/19 5' 2"  (1.575 m)   Weight:  Wt Readings from Last 1 Encounters:  03/11/19 77.6 kg   Ideal Body Weight:  50 kg  BMI:  Body mass index is 31.28 kg/m.  Estimated Nutritional Needs:   Kcal:  1700-2000kcal/day  Protein:  85-100g/day  Fluid:  >1.5L/day  Jacklynn Barnacle, MS, RD, LDN Pager number available on Amion

## 2019-03-20 NOTE — Progress Notes (Signed)
Peripherally Inserted Central Catheter/Midline Placement  The IV Nurse has discussed with the patient and/or persons authorized to consent for the patient, the purpose of this procedure and the potential benefits and risks involved with this procedure.  The benefits include less needle sticks, lab draws from the catheter, and the patient may be discharged home with the catheter. Risks include, but not limited to, infection, bleeding, blood clot (thrombus formation), and puncture of an artery; nerve damage and irregular heartbeat and possibility to perform a PICC exchange if needed/ordered by physician.  Alternatives to this procedure were also discussed.  Bard Power PICC patient education guide, fact sheet on infection prevention and patient information card has been provided to patient /or left at bedside.    PICC/Midline Placement Documentation        Cassie Gaines 03/20/2019, 11:00 AM

## 2019-03-20 NOTE — Consult Note (Signed)
PHARMACY - TOTAL PARENTERAL NUTRITION CONSULT NOTE   Indication: Prolonged ileus  Patient Measurements: Height: 5\' 2"  (157.5 cm) Weight: 171 lb (77.6 kg) IBW/kg (Calculated) : 50.1 TPN AdjBW (KG): 57 Body mass index is 31.28 kg/m.  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 trial with clear liquids multiple times  Glucose / Insulin: BG<180 Electrolytes: wnl Renal: Scr 0.82--->1.42 LFTs / TGs:  Prealbumin / albumin:  Intake / Output; MIVF:  GI Imaging: 2/9 Evidence of ongoing ileus, with no sign of obstruction as enteric contrast has traversed the colon and reaches the rectum Surgeries / Procedures: 10 Days Post-Op robotic assisted laparoscopic right colectomy for unresectable dysplastic cecal polyp  Central access: PICC placed 03/20/19 TPN start date: 03/20/19  Nutritional Goals (per RD recommendation on 03/20/19): Goal TPN rate is 83 mL/hr (provides 100 g of protein and 1914 kcals per day)  Current Nutrition:  NPO  Plan:   Start E 5/15 TPN at 40 mL/hr at 1800  Start 250 mL of 20% ILE (lipids) over 12 hrs daily  Electrolytes in TPN: 20mEq/L of Na, 72mEq/L of K, 33mEq/L of Ca, 10mEq/L of Mg, and 33mmol/L of Phos. Cl:Ac ratio 1:1  Add standard MVI (twice weekly) and trace elements to TPN  Initiate Sensitive q6h SSI and adjust as needed   Patient is at high risk of refeeding: monitor TPN labs daily until stable then Mon/Thurs  Dallie Piles 03/20/2019,8:21 AM

## 2019-03-20 NOTE — OR Nursing (Signed)
Dr. Lysle Pearl called to cancel case d/t patient refusing surgery.

## 2019-03-21 LAB — CBC WITH DIFFERENTIAL/PLATELET
Abs Immature Granulocytes: 0.04 10*3/uL (ref 0.00–0.07)
Basophils Absolute: 0 10*3/uL (ref 0.0–0.1)
Basophils Relative: 0 %
Eosinophils Absolute: 0.2 10*3/uL (ref 0.0–0.5)
Eosinophils Relative: 2 %
HCT: 38 % (ref 36.0–46.0)
Hemoglobin: 12.2 g/dL (ref 12.0–15.0)
Immature Granulocytes: 0 %
Lymphocytes Relative: 15 %
Lymphs Abs: 1.5 10*3/uL (ref 0.7–4.0)
MCH: 29.9 pg (ref 26.0–34.0)
MCHC: 32.1 g/dL (ref 30.0–36.0)
MCV: 93.1 fL (ref 80.0–100.0)
Monocytes Absolute: 1 10*3/uL (ref 0.1–1.0)
Monocytes Relative: 9 %
Neutro Abs: 7.7 10*3/uL (ref 1.7–7.7)
Neutrophils Relative %: 74 %
Platelets: 482 10*3/uL — ABNORMAL HIGH (ref 150–400)
RBC: 4.08 MIL/uL (ref 3.87–5.11)
RDW: 13.1 % (ref 11.5–15.5)
WBC: 10.4 10*3/uL (ref 4.0–10.5)
nRBC: 0 % (ref 0.0–0.2)

## 2019-03-21 LAB — COMPREHENSIVE METABOLIC PANEL
ALT: 135 U/L — ABNORMAL HIGH (ref 0–44)
AST: 69 U/L — ABNORMAL HIGH (ref 15–41)
Albumin: 2.9 g/dL — ABNORMAL LOW (ref 3.5–5.0)
Alkaline Phosphatase: 72 U/L (ref 38–126)
Anion gap: 10 (ref 5–15)
BUN: 16 mg/dL (ref 8–23)
CO2: 33 mmol/L — ABNORMAL HIGH (ref 22–32)
Calcium: 8.7 mg/dL — ABNORMAL LOW (ref 8.9–10.3)
Chloride: 97 mmol/L — ABNORMAL LOW (ref 98–111)
Creatinine, Ser: 1.24 mg/dL — ABNORMAL HIGH (ref 0.44–1.00)
GFR calc Af Amer: 54 mL/min — ABNORMAL LOW (ref >60)
GFR calc non Af Amer: 47 mL/min — ABNORMAL LOW (ref >60)
Glucose, Bld: 140 mg/dL — ABNORMAL HIGH (ref 70–99)
Potassium: 3.3 mmol/L — ABNORMAL LOW (ref 3.5–5.1)
Sodium: 140 mmol/L (ref 135–145)
Total Bilirubin: 0.8 mg/dL (ref 0.3–1.2)
Total Protein: 7.3 g/dL (ref 6.5–8.1)

## 2019-03-21 LAB — PHOSPHORUS: Phosphorus: 3.3 mg/dL (ref 2.5–4.6)

## 2019-03-21 LAB — PREALBUMIN: Prealbumin: 15.3 mg/dL — ABNORMAL LOW (ref 18–38)

## 2019-03-21 LAB — GLUCOSE, CAPILLARY
Glucose-Capillary: 120 mg/dL — ABNORMAL HIGH (ref 70–99)
Glucose-Capillary: 116 mg/dL — ABNORMAL HIGH (ref 70–99)
Glucose-Capillary: 120 mg/dL — ABNORMAL HIGH (ref 70–99)
Glucose-Capillary: 135 mg/dL — ABNORMAL HIGH (ref 70–99)

## 2019-03-21 LAB — TRIGLYCERIDES: Triglycerides: 118 mg/dL (ref <150)

## 2019-03-21 LAB — MAGNESIUM: Magnesium: 2.3 mg/dL (ref 1.7–2.4)

## 2019-03-21 MED ORDER — INSULIN ASPART 100 UNIT/ML ~~LOC~~ SOLN
0.0000 [IU] | SUBCUTANEOUS | Status: DC
  Administered 2019-03-21 – 2019-03-24 (×13): 1 [IU] via SUBCUTANEOUS
  Administered 2019-03-24: 2 [IU] via SUBCUTANEOUS
  Administered 2019-03-24 – 2019-03-25 (×3): 1 [IU] via SUBCUTANEOUS
  Administered 2019-03-25: 01:00:00 2 [IU] via SUBCUTANEOUS
  Administered 2019-03-25 – 2019-03-27 (×11): 1 [IU] via SUBCUTANEOUS
  Administered 2019-03-27: 09:00:00 2 [IU] via SUBCUTANEOUS
  Administered 2019-03-28 – 2019-03-30 (×8): 1 [IU] via SUBCUTANEOUS
  Filled 2019-03-21 (×38): qty 1

## 2019-03-21 MED ORDER — POTASSIUM CHLORIDE 10 MEQ/100ML IV SOLN
10.0000 meq | INTRAVENOUS | Status: AC
  Administered 2019-03-21 (×4): 10 meq via INTRAVENOUS
  Filled 2019-03-21 (×4): qty 100

## 2019-03-21 MED ORDER — TRACE MINERALS CU-MN-SE-ZN 300-55-60-3000 MCG/ML IV SOLN
83.0000 mL/h | INTRAVENOUS | Status: AC
  Filled 2019-03-21: qty 1992

## 2019-03-21 MED ORDER — TRACE MINERALS CU-MN-SE-ZN 300-55-60-3000 MCG/ML IV SOLN: INTRAVENOUS | Status: DC

## 2019-03-21 MED ORDER — FAT EMULSION PLANT BASED 20 % IV EMUL
250.0000 mL | INTRAVENOUS | Status: AC
  Administered 2019-03-21: 250 mL via INTRAVENOUS
  Filled 2019-03-21: qty 250

## 2019-03-21 NOTE — Consult Note (Signed)
PHARMACY - TOTAL PARENTERAL NUTRITION CONSULT NOTE   Indication: Prolonged ileus  Patient Measurements: Height: 5\' 2"  (157.5 cm) Weight: 198 lb 10.2 oz (90.1 kg) IBW/kg (Calculated) : 50.1 TPN AdjBW (KG): 57 Body mass index is 36.33 kg/m.  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 trial with clear liquids multiple times  Glucose / Insulin: BG<180 Electrolytes: wnl Renal: Scr 0.82--->1.42 LFTs / TGs:  Prealbumin / albumin:  Intake / Output; MIVF:  GI Imaging: 2/9 Evidence of ongoing ileus, with no sign of obstruction as enteric contrast has traversed the colon and reaches the rectum Surgeries / Procedures: 10 Days Post-Op robotic assisted laparoscopic right colectomy for unresectable dysplastic cecal polyp  Central access: PICC placed 03/20/19 TPN start date: 03/20/19  Nutritional Goals (per RD recommendation on 03/20/19): Goal TPN rate is 83 mL/hr (provides 100 g of protein and 1914 kcals per day)  Current Nutrition:  NPO  Plan:   Advance E 5/15 TPN to 83 mL/hr at 1800  Start 250 mL of 20% ILE (lipids) over 12 hrs daily  Electrolytes in TPN: 5mEq/L of Na, 14mEq/L of K, 61mEq/L of Ca, 82mEq/L of Mg, and 61mmol/L of Phos. Cl:Ac ratio 1:1  Add standard MVI (twice weekly) and trace elements to TPN  Initiate Sensitive q6h SSI and adjust as needed   Patient is at high risk of refeeding: monitor TPN labs daily until stable then Mon/Thurs  Oswald Hillock, PharmD, BCPS 03/21/2019,11:31 AM

## 2019-03-21 NOTE — Progress Notes (Signed)
03/21/2019  Subjective: Patient is 11 Days Post-Op status post robotic assisted laparoscopic right colectomy for unresectable dysplastic polyp in the cecum.  No acute events overnight.  PICC line and TPN was started yesterday.  She is tolerating this well.  Denies any nausea at this time.  NG tube remains to suction.  She reports some intermittent flatus but not consistent.  Denies any significant abdominal pain  Vital signs: Temp:  [98.2 F (36.8 C)-98.7 F (37.1 C)] 98.7 F (37.1 C) (02/13 0558) Pulse Rate:  [70-95] 70 (02/13 0558) Resp:  [18-20] 20 (02/13 0558) BP: (119-123)/(76-78) 119/78 (02/13 0558) SpO2:  [96 %-97 %] 96 % (02/13 0558) Weight:  [90.1 kg] 90.1 kg (02/13 0500)   Intake/Output: 02/12 0701 - 02/13 0700 In: 495.9 [I.V.:465.9; NG/GT:30] Out: 1010 [Urine:400; Emesis/NG output:600; Drains:10] Last BM Date: 03/20/19  Physical Exam: Constitutional: No acute distress Abdomen: Soft, nondistended, appropriately sore to palpation.  Incisions are clean dry and intact with Dermabond in place.  NG tube is in place to suction.  JP drain with serous fluid.  Labs:  Recent Labs    03/20/19 0657 03/21/19 0435  WBC 9.3 10.4  HGB 12.2 12.2  HCT 37.9 38.0  PLT 486* 482*   Recent Labs    03/20/19 0657 03/21/19 0435  NA 139 140  K 3.8 3.3*  CL 97* 97*  CO2 32 33*  GLUCOSE 126* 140*  BUN 10 16  CREATININE 1.42* 1.24*  CALCIUM 8.6* 8.7*   No results for input(s): LABPROT, INR in the last 72 hours.  Imaging: No results found.  Assessment/Plan: This is a 64 y.o. female s/p robotic assisted laparoscopic right colectomy.  -Continue n.p.o. with NG tube to suction and TPN for IV nutrition. -Replete potassium today and will check repeat BMP tomorrow. -Patient may be out of bed and ambulate as tolerated. -No urgent or emergent surgical intervention is needed at this point.   Melvyn Neth, [facility]

## 2019-03-22 LAB — GLUCOSE, CAPILLARY
Glucose-Capillary: 118 mg/dL — ABNORMAL HIGH (ref 70–99)
Glucose-Capillary: 136 mg/dL — ABNORMAL HIGH (ref 70–99)
Glucose-Capillary: 136 mg/dL — ABNORMAL HIGH (ref 70–99)
Glucose-Capillary: 145 mg/dL — ABNORMAL HIGH (ref 70–99)
Glucose-Capillary: 148 mg/dL — ABNORMAL HIGH (ref 70–99)

## 2019-03-22 LAB — BASIC METABOLIC PANEL
Anion gap: 11 (ref 5–15)
BUN: 22 mg/dL (ref 8–23)
CO2: 28 mmol/L (ref 22–32)
Calcium: 8.8 mg/dL — ABNORMAL LOW (ref 8.9–10.3)
Chloride: 101 mmol/L (ref 98–111)
Creatinine, Ser: 1.11 mg/dL — ABNORMAL HIGH (ref 0.44–1.00)
GFR calc Af Amer: >60 mL/min (ref >60)
GFR calc non Af Amer: 53 mL/min — ABNORMAL LOW (ref >60)
Glucose, Bld: 142 mg/dL — ABNORMAL HIGH (ref 70–99)
Potassium: 3.8 mmol/L (ref 3.5–5.1)
Sodium: 140 mmol/L (ref 135–145)

## 2019-03-22 LAB — CBC
HCT: 37.5 % (ref 36.0–46.0)
Hemoglobin: 11.9 g/dL — ABNORMAL LOW (ref 12.0–15.0)
MCH: 30.1 pg (ref 26.0–34.0)
MCHC: 31.7 g/dL (ref 30.0–36.0)
MCV: 94.7 fL (ref 80.0–100.0)
Platelets: 452 10*3/uL — ABNORMAL HIGH (ref 150–400)
RBC: 3.96 MIL/uL (ref 3.87–5.11)
RDW: 13.2 % (ref 11.5–15.5)
WBC: 8.4 10*3/uL (ref 4.0–10.5)
nRBC: 0 % (ref 0.0–0.2)

## 2019-03-22 LAB — PHOSPHORUS: Phosphorus: 3.5 mg/dL (ref 2.5–4.6)

## 2019-03-22 LAB — MAGNESIUM: Magnesium: 2.2 mg/dL (ref 1.7–2.4)

## 2019-03-22 MED ORDER — TRACE MINERALS CU-MN-SE-ZN 300-55-60-3000 MCG/ML IV SOLN
83.0000 mL/h | INTRAVENOUS | Status: AC
  Filled 2019-03-22: qty 1992

## 2019-03-22 MED ORDER — FAT EMULSION PLANT BASED 20 % IV EMUL
250.0000 mL | INTRAVENOUS | Status: AC
  Administered 2019-03-22: 18:00:00 250 mL via INTRAVENOUS
  Filled 2019-03-22: qty 250

## 2019-03-22 NOTE — Progress Notes (Signed)
03/22/2019  Subjective: Patient is 63 Days Post-Op s/p robotic assisted laparoscopic right colectomy.  No acute events overnight.  Having some flatus, but also burping.  Denies any significant pain.  Vital signs: Temp:  [98 F (36.7 C)-98.5 F (36.9 C)] 98.5 F (36.9 C) (02/14 1220) Pulse Rate:  [70-85] 70 (02/14 1220) Resp:  [20] 20 (02/14 1220) BP: (135-141)/(76-84) 139/79 (02/14 1220) SpO2:  [97 %] 97 % (02/14 1220)   Intake/Output: 02/13 0701 - 02/14 0700 In: 1956.4 [I.V.:1556.4; IV Piggyback:400] Out: 800 [Urine:400; Emesis/NG output:390; Drains:10] Last BM Date: 03/20/19  Physical Exam: Constitutional: No acute distress Abdomen:  Soft, non-distended, appropriately sore to palpation.  Incisions clean, dry, intact.  Drain with serous fluid.  Labs:  Recent Labs    03/21/19 0435 03/22/19 0609  WBC 10.4 8.4  HGB 12.2 11.9*  HCT 38.0 37.5  PLT 482* 452*   Recent Labs    03/21/19 0435 03/22/19 0609  NA 140 140  K 3.3* 3.8  CL 97* 101  CO2 33* 28  GLUCOSE 140* 142*  BUN 16 22  CREATININE 1.24* 1.11*  CALCIUM 8.7* 8.8*   No results for input(s): LABPROT, INR in the last 72 hours.  Imaging: No results found.  Assessment/Plan: This is a 64 y.o. female s/p robotic assisted laparoscopic right colectomy.  --Continue NPO with TPN for contained anastomotic leak. --OOB, ambulate --labs in AM --DVT/GI prophylaxis   Melvyn Neth, [place]

## 2019-03-22 NOTE — Consult Note (Signed)
PHARMACY - TOTAL PARENTERAL NUTRITION CONSULT NOTE   Indication: Prolonged ileus  Patient Measurements: Height: 5\' 2"  (157.5 cm) Weight: 198 lb 10.2 oz (90.1 kg) IBW/kg (Calculated) : 50.1 TPN AdjBW (KG): 57 Body mass index is 36.33 kg/m.  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 trial with clear liquids multiple times  Glucose / Insulin: BG<180 Electrolytes: wnl Renal: Scr 0.82--->1.42 LFTs / TGs:  Prealbumin / albumin:  Intake / Output; MIVF:  GI Imaging: 2/9 Evidence of ongoing ileus, with no sign of obstruction as enteric contrast has traversed the colon and reaches the rectum Surgeries / Procedures: 10 Days Post-Op robotic assisted laparoscopic right colectomy for unresectable dysplastic cecal polyp  Central access: PICC placed 03/20/19 TPN start date: 03/20/19  Nutritional Goals (per RD recommendation on 03/20/19): Goal TPN rate is 83 mL/hr (provides 100 g of protein and 1914 kcals per day)  Current Nutrition:  NPO  Plan:   Advance E 5/15 TPN to 83 mL/hr at 1800  Start 250 mL of 20% ILE (lipids) over 12 hrs daily  Electrolytes in TPN: 44mEq/L of Na, 70mEq/L of K, 35mEq/L of Ca, 56mEq/L of Mg, and 13mmol/L of Phos. Cl:Ac ratio 1:1  Add standard MVI (twice weekly) and trace elements to TPN  Initiate Sensitive q6h SSI and adjust as needed   Patient is at high risk of refeeding: monitor TPN labs daily until stable then Mon/Thurs  Oswald Hillock, PharmD, BCPS 03/22/2019,10:48 AM

## 2019-03-23 LAB — CBC
HCT: 37.9 % (ref 36.0–46.0)
Hemoglobin: 12.2 g/dL (ref 12.0–15.0)
MCH: 30 pg (ref 26.0–34.0)
MCHC: 32.2 g/dL (ref 30.0–36.0)
MCV: 93.1 fL (ref 80.0–100.0)
Platelets: 458 10*3/uL — ABNORMAL HIGH (ref 150–400)
RBC: 4.07 MIL/uL (ref 3.87–5.11)
RDW: 13 % (ref 11.5–15.5)
WBC: 9.2 10*3/uL (ref 4.0–10.5)
nRBC: 0 % (ref 0.0–0.2)

## 2019-03-23 LAB — DIFFERENTIAL
Abs Immature Granulocytes: 0.05 10*3/uL (ref 0.00–0.07)
Basophils Absolute: 0 10*3/uL (ref 0.0–0.1)
Basophils Relative: 0 %
Eosinophils Absolute: 0.3 10*3/uL (ref 0.0–0.5)
Eosinophils Relative: 3 %
Immature Granulocytes: 1 %
Lymphocytes Relative: 23 %
Lymphs Abs: 2.1 10*3/uL (ref 0.7–4.0)
Monocytes Absolute: 1.2 10*3/uL — ABNORMAL HIGH (ref 0.1–1.0)
Monocytes Relative: 13 %
Neutro Abs: 5.6 10*3/uL (ref 1.7–7.7)
Neutrophils Relative %: 60 %

## 2019-03-23 LAB — COMPREHENSIVE METABOLIC PANEL
ALT: 84 U/L — ABNORMAL HIGH (ref 0–44)
AST: 36 U/L (ref 15–41)
Albumin: 3.1 g/dL — ABNORMAL LOW (ref 3.5–5.0)
Alkaline Phosphatase: 75 U/L (ref 38–126)
Anion gap: 11 (ref 5–15)
BUN: 26 mg/dL — ABNORMAL HIGH (ref 8–23)
CO2: 28 mmol/L (ref 22–32)
Calcium: 9 mg/dL (ref 8.9–10.3)
Chloride: 100 mmol/L (ref 98–111)
Creatinine, Ser: 0.95 mg/dL (ref 0.44–1.00)
GFR calc Af Amer: >60 mL/min (ref >60)
GFR calc non Af Amer: >60 mL/min (ref >60)
Glucose, Bld: 122 mg/dL — ABNORMAL HIGH (ref 70–99)
Potassium: 3.7 mmol/L (ref 3.5–5.1)
Sodium: 139 mmol/L (ref 135–145)
Total Bilirubin: 0.4 mg/dL (ref 0.3–1.2)
Total Protein: 8 g/dL (ref 6.5–8.1)

## 2019-03-23 LAB — TRIGLYCERIDES: Triglycerides: 121 mg/dL (ref <150)

## 2019-03-23 LAB — GLUCOSE, CAPILLARY
Glucose-Capillary: 125 mg/dL — ABNORMAL HIGH (ref 70–99)
Glucose-Capillary: 130 mg/dL — ABNORMAL HIGH (ref 70–99)
Glucose-Capillary: 133 mg/dL — ABNORMAL HIGH (ref 70–99)
Glucose-Capillary: 134 mg/dL — ABNORMAL HIGH (ref 70–99)
Glucose-Capillary: 135 mg/dL — ABNORMAL HIGH (ref 70–99)
Glucose-Capillary: 147 mg/dL — ABNORMAL HIGH (ref 70–99)

## 2019-03-23 LAB — MAGNESIUM: Magnesium: 2.3 mg/dL (ref 1.7–2.4)

## 2019-03-23 LAB — PREALBUMIN: Prealbumin: 17.9 mg/dL — ABNORMAL LOW (ref 18–38)

## 2019-03-23 LAB — PHOSPHORUS: Phosphorus: 3.9 mg/dL (ref 2.5–4.6)

## 2019-03-23 MED ORDER — TRACE MINERALS CU-MN-SE-ZN 300-55-60-3000 MCG/ML IV SOLN
83.0000 mL/h | INTRAVENOUS | Status: AC
  Filled 2019-03-23: qty 1992

## 2019-03-23 MED ORDER — ENOXAPARIN SODIUM 40 MG/0.4ML ~~LOC~~ SOLN
40.0000 mg | SUBCUTANEOUS | Status: DC
  Administered 2019-03-24 – 2019-03-31 (×7): 40 mg via SUBCUTANEOUS
  Filled 2019-03-23 (×7): qty 0.4

## 2019-03-23 MED ORDER — LOSARTAN POTASSIUM 50 MG PO TABS
50.0000 mg | ORAL_TABLET | Freq: Two times a day (BID) | ORAL | Status: DC
  Administered 2019-03-23 – 2019-03-31 (×13): 50 mg via ORAL
  Filled 2019-03-23 (×16): qty 1

## 2019-03-23 MED ORDER — FAT EMULSION PLANT BASED 20 % IV EMUL
250.0000 mL | INTRAVENOUS | Status: AC
  Administered 2019-03-23: 19:00:00 250 mL via INTRAVENOUS
  Filled 2019-03-23: qty 250

## 2019-03-23 NOTE — Progress Notes (Signed)
Subjective:  CC: Cassie Gaines is a 64 y.o. female  Hospital stay day 13, 13 Days Post-Op robotic assisted laparoscopic right colectomy for unresectable dysplastic cecal polyp  HPI: No acute issues overnight.   ROS:  General: Denies weight loss, weight gain, fatigue, fevers, chills, and night sweats. Heart: Denies chest pain, palpitations, racing heart, irregular heartbeat, leg pain or swelling, and decreased activity tolerance. Respiratory: Denies breathing difficulty, shortness of breath, wheezing, cough, and sputum. GI: Denies change in appetite, constipation, diarrhea, and blood in stool. GU: Denies difficulty urinating, pain with urinating, urgency, frequency, blood in urine.   Objective:   Temp:  [97.8 F (36.6 C)-98.2 F (36.8 C)] 97.9 F (36.6 C) (02/15 1200) Pulse Rate:  [77-91] 80 (02/15 1200) BP: (132-146)/(88-97) 132/88 (02/15 1200) SpO2:  [98 %-100 %] 98 % (02/15 1200) Weight:  [90.1 kg] 90.1 kg (02/15 0459)     Height: 5\' 2"  (157.5 cm) Weight: 90.1 kg BMI (Calculated): 36.32   Intake/Output this shift:   Intake/Output Summary (Last 24 hours) at 03/23/2019 1621 Last data filed at 03/23/2019 1404 Gross per 24 hour  Intake 2192.89 ml  Output 1750 ml  Net 442.89 ml    Constitutional :  alert, cooperative, appears stated age and no distress  Respiratory:  clear to auscultation bilaterally  Cardiovascular:  regular rate and rhythm  Gastrointestinal: still soft, no TTP, no guarding at all even with deep palpation.  thin serous output noted from JP.  incisions c/d/i.  NG dark bilious output  Skin: Cool and moist.  Incisions clean dry and intact.  Psychiatric: Normal affect, non-agitated, not confused       LABS:  CMP Latest Ref Rng & Units 03/23/2019 03/22/2019 03/21/2019  Glucose 70 - 99 mg/dL 122(H) 142(H) 140(H)  BUN 8 - 23 mg/dL 26(H) 22 16  Creatinine 0.44 - 1.00 mg/dL 0.95 1.11(H) 1.24(H)  Sodium 135 - 145 mmol/L 139 140 140  Potassium 3.5 - 5.1 mmol/L 3.7  3.8 3.3(L)  Chloride 98 - 111 mmol/L 100 101 97(L)  CO2 22 - 32 mmol/L 28 28 33(H)  Calcium 8.9 - 10.3 mg/dL 9.0 8.8(L) 8.7(L)  Total Protein 6.5 - 8.1 g/dL 8.0 - 7.3  Total Bilirubin 0.3 - 1.2 mg/dL 0.4 - 0.8  Alkaline Phos 38 - 126 U/L 75 - 72  AST 15 - 41 U/L 36 - 69(H)  ALT 0 - 44 U/L 84(H) - 135(H)   CBC Latest Ref Rng & Units 03/23/2019 03/22/2019 03/21/2019  WBC 4.0 - 10.5 K/uL 9.2 8.4 10.4  Hemoglobin 12.0 - 15.0 g/dL 12.2 11.9(L) 12.2  Hematocrit 36.0 - 46.0 % 37.9 37.5 38.0  Platelets 150 - 400 K/uL 458(H) 452(H) 482(H)    RADS: CLINICAL DATA:  64 year old female with a history right colectomy, and known fluid and gas collection at the anastomosis, concern for leak  EXAM: CT ABDOMEN AND PELVIS WITH CONTRAST  TECHNIQUE: Multidetector CT imaging of the abdomen and pelvis was performed using the standard protocol following bolus administration of intravenous contrast.  CONTRAST:  150mL OMNIPAQUE IOHEXOL 300 MG/ML  SOLN  COMPARISON:  03/15/2019  FINDINGS: Lower chest: Atelectasis/scarring at the lung bases.  Hepatobiliary: Unremarkable liver.  Unremarkable gallbladder.  Pancreas: Unremarkable pancreas  Spleen: Unremarkable  Adrenals/Urinary Tract:  - Right adrenal gland:  Unremarkable  - Left adrenal gland: Unremarkable.  - Right kidney: No hydronephrosis, nephrolithiasis, inflammation, or ureteral dilation. No focal lesion.  - Left Kidney: No hydronephrosis, nephrolithiasis, inflammation, or ureteral dilation. No focal lesion.  -  Urinary Bladder: Urinary bladder relatively decompressed.  Stomach/Bowel:  - Stomach: Gastric tube terminates within the stomach lumen. Stomach is relatively decompressed.  - Small bowel: Enteric contrast has traversed the small bowel, and reaches the anastomosis. Contrast has extended through the colon to the rectum.  Surgical changes of right partial colectomy and anastomosis. The coronal reformatted  images demonstrate a linear hyperdensity extending from the medial margin of the anastomosis, in a crescent configuration, from the series imaging sequence 36-40 of series 5. This is at the site of persisting fluid and gas collection. This crescentic hyperdensity does not appear to represent a suture line.  Persisting dilated small bowel loops.  - Colon: Colon is relatively decompressed. Contrast reaches distal colon. Diverticular disease.  Surgical drain remains in place from a left lower abdominal approach, terminating in the right abdomen.  Persisting free air within the abdomen, which overall appears decreased in volume compared to the prior CT. Overall the fluid and gas collection has not increased significantly in size, with the largest diameter 48 mm, essentially the same as the prior 5 cm.  Vascular/Lymphatic: No significant atherosclerosis. Unremarkable venous structures.  No adenopathy.  Reproductive: Fibroid uterus.  Unremarkable adnexa.  Other: Surgical changes of the left abdominal wall, similar to the prior.  Musculoskeletal: No displaced fracture.  No bony canal narrowing.  IMPRESSION: The CT confirms a small anastomotic leak at the site of partial right colectomy, however, the adjacent fluid and gas collection, presumably phlegmon/abscess, has not increased in size from the comparison CT, and perhaps is overall slightly smaller. Similar to slightly decreased volume of free intraperitoneal air within the abdomen.  Evidence of ongoing ileus, with no sign of obstruction as enteric contrast has traversed the colon and reaches the rectum.  Additional ancillary findings as above.   Electronically Signed   By: Corrie Mckusick D.O.   On: 03/17/2019 16:39  Assessment:   S/p robotic assisted laparoscopic right colectomy for unresectable 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  CT concerns for contained anastamosis leak.  Doing well on TPN.  Exam essentially  unchanged.  NG output still high at 850 mL the past 24 hours of dark thick bilious output.  Likely no improvement in the persistent ileus based on that.  JP drain was removed at bedside without any issues today due to the persistently low serous output.  We will continue to monitor to see if any decrease in NG output and less bilious quality.

## 2019-03-23 NOTE — Consult Note (Signed)
PHARMACY - TOTAL PARENTERAL NUTRITION CONSULT NOTE   Indication: Prolonged ileus  Patient Measurements: Height: 5\' 2"  (157.5 cm) Weight: 198 lb 9.8 oz (90.1 kg) IBW/kg (Calculated) : 50.1 TPN AdjBW (KG): 57 Body mass index is 36.33 kg/m.  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 trial with clear liquids multiple times  Glucose / Insulin: BG<180, 5u SSI last 24h Electrolytes: wnl Renal: Scr 0.82--->1.42-->0.95 LFTs / TGs:  Prealbumin / albumin:  Intake / Output; MIVF:  GI Imaging: 2/9 Evidence of ongoing ileus, with no sign of obstruction as enteric contrast has traversed the colon and reaches the rectum Surgeries / Procedures: 53 Days Post-Op robotic assisted laparoscopic right colectomy for unresectable dysplastic cecal polyp  Central access: PICC placed 03/20/19 TPN start date: 03/20/19  Nutritional Goals (per RD recommendation on 03/20/19): Goal TPN rate is 83 mL/hr (provides 100 g of protein and 1914 kcals per day)  Current Nutrition:  NPO  Plan:   Continue E 5/15 TPN to 83 mL/hr at 1800  continue 250 mL of 20% ILE (lipids) over 12 hrs daily  Electrolytes in TPN: 46mEq/L of Na, 68mEq/L of K, 31mEq/L of Ca, 6mEq/L of Mg, and 59mmol/L of Phos. Cl:Ac ratio 1:1  Add standard MVI (twice weekly) and trace elements to TPN  continue Sensitive q4h SSI and adjust as needed   monitor TPN labs Mon/Thurs per protocol  Dallie Piles, PharmD, BCPS 03/23/2019,7:52 AM

## 2019-03-23 NOTE — Progress Notes (Signed)
Nutrition Follow-up  DOCUMENTATION CODES:   Obesity unspecified  INTERVENTION:   Continue Clinimix E 5/15 at 83 mL/hr over 24 hrs + 250 mL of 20% ILE (lipids) over 12 hrs daily.   Regimen provides 1914 kcal, 100 grams of protein, 1992 mL from Clinimix daily.  Provide adult MVI in TPN per guidelines in setting of shortage. Provide trace elements daily in TPN.  Daily weights   NUTRITION DIAGNOSIS:   Inadequate oral intake related to acute illness as evidenced by NPO status. Ongoing - addressing with TPN  GOAL:   Patient will meet greater than or equal to 90% of their needs -Met with TPN.  MONITOR:   Diet advancement, Labs, Weight trends, Skin, I & O's, TPN  ASSESSMENT:   64 y/o female with h/o HTN, GERD, hiatal herna and anemia s/p R colectomy secondary to cecal mass, now complicated by post op ileus   Pt tolerating TPN well at goal rate. Refeed labs stable. Per chart, pt passing flatus and having BMs. Pt remains NPO. NGT with 842m output. Per chart, pt up ~27lbs since admit; pt +6.7L on her I & Os. Pt is weight stable since starting TPN.   Medications reviewed and include: insulin, reglan  Labs reviewed: K 3.7 wnl, P 3.9 wnl, Mg 2.3 wnl, BUN 26(H) Pre-albumin 17.9(L) Triglycerides 121 cbgs- 135, 134, 125 x 24 hrs  Diet Order:   Diet Order            Diet NPO time specified Except for: Sips with Meds, Ice Chips  Diet effective midnight             EDUCATION NEEDS:   No education needs have been identified at this time  Skin:  Skin Assessment: Skin Integrity Issues:(closed incision to abdomen)  Last BM:  2/14- type 4  Height:   Ht Readings from Last 1 Encounters:  03/11/19 _0  (1.575 m)   Weight:   Wt Readings from Last 1 Encounters:  03/23/19 90.1 kg   Ideal Body Weight:  50 kg  BMI:  Body mass index is 36.33 kg/m.  Estimated Nutritional Needs:   Kcal:  1700-2000kcal/day  Protein:  85-100g/day  Fluid:  >1.5L/day  CKoleen DistanceMS,  RD, LDN Contact information available in Amion

## 2019-03-24 LAB — GLUCOSE, CAPILLARY
Glucose-Capillary: 139 mg/dL — ABNORMAL HIGH (ref 70–99)
Glucose-Capillary: 142 mg/dL — ABNORMAL HIGH (ref 70–99)
Glucose-Capillary: 143 mg/dL — ABNORMAL HIGH (ref 70–99)
Glucose-Capillary: 144 mg/dL — ABNORMAL HIGH (ref 70–99)
Glucose-Capillary: 145 mg/dL — ABNORMAL HIGH (ref 70–99)
Glucose-Capillary: 158 mg/dL — ABNORMAL HIGH (ref 70–99)

## 2019-03-24 LAB — PHOSPHORUS: Phosphorus: 4 mg/dL (ref 2.5–4.6)

## 2019-03-24 MED ORDER — FAT EMULSION PLANT BASED 20 % IV EMUL
250.0000 mL | INTRAVENOUS | Status: AC
  Administered 2019-03-24: 250 mL via INTRAVENOUS
  Filled 2019-03-24: qty 250

## 2019-03-24 MED ORDER — PANTOPRAZOLE SODIUM 40 MG IV SOLR
40.0000 mg | Freq: Two times a day (BID) | INTRAVENOUS | Status: DC
  Administered 2019-03-24 – 2019-03-31 (×14): 40 mg via INTRAVENOUS
  Filled 2019-03-24 (×15): qty 40

## 2019-03-24 MED ORDER — TRACE MINERALS CU-MN-SE-ZN 300-55-60-3000 MCG/ML IV SOLN
83.0000 mL/h | INTRAVENOUS | Status: AC
  Filled 2019-03-24: qty 1992

## 2019-03-24 NOTE — Consult Note (Signed)
PHARMACY - TOTAL PARENTERAL NUTRITION CONSULT NOTE   Indication: Prolonged ileus  Patient Measurements: Height: 5\' 2"  (157.5 cm) Weight: 198 lb 9.8 oz (90.1 kg) IBW/kg (Calculated) : 50.1 TPN AdjBW (KG): 57 Body mass index is 36.33 kg/m.  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 trial with clear liquids multiple times  Glucose / Insulin: BG<180, 6u SSI last 24h Electrolytes: wnl Renal: Scr 0.82--->1.42-->0.95 LFTs / TGs:  Prealbumin / albumin:  Intake / Output; MIVF:  GI Imaging: 2/9 Evidence of ongoing ileus, with no sign of obstruction as enteric contrast has traversed the colon and reaches the rectum Surgeries / Procedures: 75 Days Post-Op robotic assisted laparoscopic right colectomy for unresectable dysplastic cecal polyp  Central access: PICC placed 03/20/19 TPN start date: 03/20/19  Nutritional Goals (per RD recommendation on 03/20/19): Goal TPN rate is 83 mL/hr (provides 100 g of protein and 1914 kcals per day)  Current Nutrition:  NPO  Plan:   Continue E 5/15 TPN to 83 mL/hr at 1800  continue 250 mL of 20% ILE (lipids) over 12 hrs daily  Electrolytes in TPN: 77mEq/L of Na, 36mEq/L of K, 23mEq/L of Ca, 13mEq/L of Mg, and 54mmol/L of Phos. Cl:Ac ratio 1:1  Add standard MVI (twice weekly) and trace elements to TPN  continue Sensitive q4h SSI and adjust as needed   monitor TPN labs Mon/Thurs per protocol  Dallie Piles, PharmD, BCPS 03/24/2019,7:53 AM

## 2019-03-25 LAB — GLUCOSE, CAPILLARY
Glucose-Capillary: 126 mg/dL — ABNORMAL HIGH (ref 70–99)
Glucose-Capillary: 128 mg/dL — ABNORMAL HIGH (ref 70–99)
Glucose-Capillary: 140 mg/dL — ABNORMAL HIGH (ref 70–99)
Glucose-Capillary: 141 mg/dL — ABNORMAL HIGH (ref 70–99)
Glucose-Capillary: 145 mg/dL — ABNORMAL HIGH (ref 70–99)
Glucose-Capillary: 157 mg/dL — ABNORMAL HIGH (ref 70–99)

## 2019-03-25 LAB — PHOSPHORUS: Phosphorus: 4.8 mg/dL — ABNORMAL HIGH (ref 2.5–4.6)

## 2019-03-25 MED ORDER — TRACE MINERALS CU-MN-SE-ZN 300-55-60-3000 MCG/ML IV SOLN
83.0000 mL/h | INTRAVENOUS | Status: AC
  Filled 2019-03-25: qty 1992

## 2019-03-25 MED ORDER — FAT EMULSION PLANT BASED 20 % IV EMUL
250.0000 mL | INTRAVENOUS | Status: AC
  Administered 2019-03-25: 17:00:00 250 mL via INTRAVENOUS
  Filled 2019-03-25: qty 250

## 2019-03-25 NOTE — Care Management (Signed)
RNCM received Message to "call Kennyth Lose case manager with [facility] would like to know current care now and can assist with discharge planning needs. [phone]"  RNCM attempted to call.  Voicemail left

## 2019-03-25 NOTE — Consult Note (Signed)
PHARMACY - TOTAL PARENTERAL NUTRITION CONSULT NOTE   Indication: Prolonged ileus  Patient Measurements: Height: 5\' 2"  (157.5 cm) Weight: 185 lb (83.9 kg) IBW/kg (Calculated) : 50.1 TPN AdjBW (KG): 57 Body mass index is 33.84 kg/m.  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 trial with clear liquids multiple times  Glucose / Insulin: BG<180, 7u SSI last 24h Electrolytes: wnl Renal: Scr 0.82--->1.42-->0.95 LFTs / TGs:  Prealbumin / albumin:  Intake / Output; MIVF:  GI Imaging: 2/9 Evidence of ongoing ileus, with no sign of obstruction as enteric contrast has traversed the colon and reaches the rectum Surgeries / Procedures: 70 Days Post-Op robotic assisted laparoscopic right colectomy for unresectable dysplastic cecal polyp  Central access: PICC placed 03/20/19 TPN start date: 03/20/19  Nutritional Goals (per RD recommendation on 03/20/19): Goal TPN rate is 83 mL/hr (provides 100 g of protein and 1914 kcals per day)  Current Nutrition:  NPO  Plan:   Continue E 5/15 TPN to 83 mL/hr at 1800  continue 250 mL of 20% ILE (lipids) over 12 hrs daily  Electrolytes in TPN: 31mEq/L of Na, 2mEq/L of K, 63mEq/L of Ca, 66mEq/L of Mg, and 69mmol/L of Phos. Cl:Ac ratio 1:1  Add standard MVI (twice weekly) and trace elements to TPN  continue Sensitive q4h SSI and adjust as needed   monitor TPN labs Mon/Thurs per protocol  Dallie Piles, PharmD, BCPS 03/25/2019,7:01 AM

## 2019-03-25 NOTE — Progress Notes (Signed)
Subjective:  CC: Cassie Gaines is a 64 y.o. 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 Thinks NG output is darker.   ROS:  General: Denies weight loss, weight gain, fatigue, fevers, chills, and night sweats. Heart: Denies chest pain, palpitations, racing heart, irregular heartbeat, leg pain or swelling, and decreased activity tolerance. Respiratory: Denies breathing difficulty, shortness of breath, wheezing, cough, and sputum. GI: Denies change in appetite, constipation, diarrhea, and blood in stool. GU: Denies difficulty urinating, pain with urinating, urgency, frequency, blood in urine.   Objective:   Temp:  [97.9 F (36.6 C)-99.2 F (37.3 C)] 99.2 F (37.3 C) (02/17 0454) Pulse Rate:  [76-101] 89 (02/17 0450) Resp:  [16-19] 19 (02/17 0450) BP: (99-127)/(72-87) 99/72 (02/17 0450) SpO2:  [97 %-100 %] 97 % (02/17 0450) Weight:  [83.9 kg] 83.9 kg (02/17 0552)     Height: 5\' 2"  (157.5 cm) Weight: 83.9 kg BMI (Calculated): 33.83   Intake/Output this shift:   Intake/Output Summary (Last 24 hours) at 03/25/2019 0741 Last data filed at 03/25/2019 [id number] Gross per 24 hour  Intake 2196.65 ml  Output 932 ml  Net 1264.65 ml    Constitutional :  alert, cooperative, appears stated age and no distress  Respiratory:  clear to auscultation bilaterally  Cardiovascular:  regular rate and rhythm  Gastrointestinal: still soft, no TTP, no guarding at all even with deep palpation.  thin serous output noted from JP.  incisions c/d/i.  NG dark bilious output, possible mix with old blood?  Skin: Cool and moist.  Incisions clean dry and intact.  Psychiatric: Normal affect, non-agitated, not confused       LABS:  CMP Latest Ref Rng & Units 03/23/2019 03/22/2019 03/21/2019  Glucose 70 - 99 mg/dL 122(H) 142(H) 140(H)  BUN 8 - 23 mg/dL 26(H) 22 16  Creatinine 0.44 - 1.00 mg/dL 0.95  1.11(H) 1.24(H)  Sodium 135 - 145 mmol/L 139 140 140  Potassium 3.5 - 5.1 mmol/L 3.7 3.8 3.3(L)  Chloride 98 - 111 mmol/L 100 101 97(L)  CO2 22 - 32 mmol/L 28 28 33(H)  Calcium 8.9 - 10.3 mg/dL 9.0 8.8(L) 8.7(L)  Total Protein 6.5 - 8.1 g/dL 8.0 - 7.3  Total Bilirubin 0.3 - 1.2 mg/dL 0.4 - 0.8  Alkaline Phos 38 - 126 U/L 75 - 72  AST 15 - 41 U/L 36 - 69(H)  ALT 0 - 44 U/L 84(H) - 135(H)   CBC Latest Ref Rng & Units 03/23/2019 03/22/2019 03/21/2019  WBC 4.0 - 10.5 K/uL 9.2 8.4 10.4  Hemoglobin 12.0 - 15.0 g/dL 12.2 11.9(L) 12.2  Hematocrit 36.0 - 46.0 % 37.9 37.5 38.0  Platelets 150 - 400 K/uL 458(H) 452(H) 482(H)    RADS: CLINICAL DATA:  64 year old female with a history right colectomy, and known fluid and gas collection at the anastomosis, concern for leak  EXAM: CT ABDOMEN AND PELVIS WITH CONTRAST  TECHNIQUE: Multidetector CT imaging of the abdomen and pelvis was performed using the standard protocol following bolus administration of intravenous contrast.  CONTRAST:  134mL OMNIPAQUE IOHEXOL 300 MG/ML  SOLN  COMPARISON:  03/15/2019  FINDINGS: Lower chest: Atelectasis/scarring at the lung bases.  Hepatobiliary: Unremarkable liver.  Unremarkable gallbladder.  Pancreas: Unremarkable pancreas  Spleen: Unremarkable  Adrenals/Urinary Tract:  - Right adrenal gland:  Unremarkable  - Left adrenal gland: Unremarkable.  - Right kidney: No hydronephrosis, nephrolithiasis, inflammation, or ureteral dilation. No  focal lesion.  - Left Kidney: No hydronephrosis, nephrolithiasis, inflammation, or ureteral dilation. No focal lesion.  - Urinary Bladder: Urinary bladder relatively decompressed.  Stomach/Bowel:  - Stomach: Gastric tube terminates within the stomach lumen. Stomach is relatively decompressed.  - Small bowel: Enteric contrast has traversed the small bowel, and reaches the anastomosis. Contrast has extended through the colon to the  rectum.  Surgical changes of right partial colectomy and anastomosis. The coronal reformatted images demonstrate a linear hyperdensity extending from the medial margin of the anastomosis, in a crescent configuration, from the series imaging sequence 36-40 of series 5. This is at the site of persisting fluid and gas collection. This crescentic hyperdensity does not appear to represent a suture line.  Persisting dilated small bowel loops.  - Colon: Colon is relatively decompressed. Contrast reaches distal colon. Diverticular disease.  Surgical drain remains in place from a left lower abdominal approach, terminating in the right abdomen.  Persisting free air within the abdomen, which overall appears decreased in volume compared to the prior CT. Overall the fluid and gas collection has not increased significantly in size, with the largest diameter 48 mm, essentially the same as the prior 5 cm.  Vascular/Lymphatic: No significant atherosclerosis. Unremarkable venous structures.  No adenopathy.  Reproductive: Fibroid uterus.  Unremarkable adnexa.  Other: Surgical changes of the left abdominal wall, similar to the prior.  Musculoskeletal: No displaced fracture.  No bony canal narrowing.  IMPRESSION: The CT confirms a small anastomotic leak at the site of partial right colectomy, however, the adjacent fluid and gas collection, presumably phlegmon/abscess, has not increased in size from the comparison CT, and perhaps is overall slightly smaller. Similar to slightly decreased volume of free intraperitoneal air within the abdomen.  Evidence of ongoing ileus, with no sign of obstruction as enteric contrast has traversed the colon and reaches the rectum.  Additional ancillary findings as above.   Electronically Signed   By: Corrie Mckusick D.O.   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  CT concerns for contained anastamosis leak.  Doing well on TPN.  Exam essentially unchanged.  NG output still high at 1050 mL the past 24 hours of dark thick bilious output, now with possible blood.  Will add protonix.    Still likely no improvement in the persistent ileus based on that. We will continue to monitor to see if any decrease in NG output and less bilious quality.

## 2019-03-26 LAB — PHOSPHORUS: Phosphorus: 4.6 mg/dL (ref 2.5–4.6)

## 2019-03-26 LAB — COMPREHENSIVE METABOLIC PANEL
ALT: 87 U/L — ABNORMAL HIGH (ref 0–44)
AST: 39 U/L (ref 15–41)
Albumin: 3 g/dL — ABNORMAL LOW (ref 3.5–5.0)
Alkaline Phosphatase: 109 U/L (ref 38–126)
Anion gap: 10 (ref 5–15)
BUN: 50 mg/dL — ABNORMAL HIGH (ref 8–23)
CO2: 29 mmol/L (ref 22–32)
Calcium: 9.1 mg/dL (ref 8.9–10.3)
Chloride: 100 mmol/L (ref 98–111)
Creatinine, Ser: 1.37 mg/dL — ABNORMAL HIGH (ref 0.44–1.00)
GFR calc Af Amer: 48 mL/min — ABNORMAL LOW (ref >60)
GFR calc non Af Amer: 41 mL/min — ABNORMAL LOW (ref >60)
Glucose, Bld: 122 mg/dL — ABNORMAL HIGH (ref 70–99)
Potassium: 3.6 mmol/L (ref 3.5–5.1)
Sodium: 139 mmol/L (ref 135–145)
Total Bilirubin: 0.5 mg/dL (ref 0.3–1.2)
Total Protein: 7.6 g/dL (ref 6.5–8.1)

## 2019-03-26 LAB — GLUCOSE, CAPILLARY
Glucose-Capillary: 103 mg/dL — ABNORMAL HIGH (ref 70–99)
Glucose-Capillary: 125 mg/dL — ABNORMAL HIGH (ref 70–99)
Glucose-Capillary: 135 mg/dL — ABNORMAL HIGH (ref 70–99)
Glucose-Capillary: 135 mg/dL — ABNORMAL HIGH (ref 70–99)
Glucose-Capillary: 136 mg/dL — ABNORMAL HIGH (ref 70–99)
Glucose-Capillary: 137 mg/dL — ABNORMAL HIGH (ref 70–99)
Glucose-Capillary: 98 mg/dL (ref 70–99)

## 2019-03-26 LAB — MAGNESIUM: Magnesium: 2.4 mg/dL (ref 1.7–2.4)

## 2019-03-26 MED ORDER — TRACE MINERALS CU-MN-SE-ZN 300-55-60-3000 MCG/ML IV SOLN
83.0000 mL/h | INTRAVENOUS | Status: AC
  Filled 2019-03-26: qty 1992

## 2019-03-26 MED ORDER — FAT EMULSION PLANT BASED 20 % IV EMUL
250.0000 mL | INTRAVENOUS | Status: AC
  Administered 2019-03-26: 250 mL via INTRAVENOUS
  Filled 2019-03-26: qty 250

## 2019-03-26 NOTE — Progress Notes (Signed)
Subjective:  CC: Cassie Gaines is a 64 y.o. 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    ROS:  General: Denies weight loss, weight gain, fatigue, fevers, chills, and night sweats. Heart: Denies chest pain, palpitations, racing heart, irregular heartbeat, leg pain or swelling, and decreased activity tolerance. Respiratory: Denies breathing difficulty, shortness of breath, wheezing, cough, and sputum. GI: Denies change in appetite, constipation, diarrhea, and blood in stool. GU: Denies difficulty urinating, pain with urinating, urgency, frequency, blood in urine.   Objective:   Temp:  [97.7 F (36.5 C)-98.6 F (37 C)] 97.7 F (36.5 C) (02/18 0533) Pulse Rate:  [80-90] 84 (02/18 0839) Resp:  [16-18] 16 (02/18 0533) BP: (90-110)/(57-73) 95/61 (02/18 0839) SpO2:  [99 %-100 %] 100 % (02/18 0533) Weight:  [87.8 kg] 87.8 kg (02/18 0500)     Height: 5\' 2"  (157.5 cm) Weight: 87.8 kg BMI (Calculated): 35.39   Intake/Output this shift:   Intake/Output Summary (Last 24 hours) at 03/26/2019 1036 Last data filed at 03/26/2019 [id number] Gross per 24 hour  Intake 2157.29 ml  Output 1070 ml  Net 1087.29 ml    Constitutional :  alert, cooperative, appears stated age and no distress  Respiratory:  clear to auscultation bilaterally  Cardiovascular:  regular rate and rhythm  Gastrointestinal: still soft, no TTP, no guarding at all even with deep palpation.  thin serous output noted from JP.  incisions c/d/i.  NG dark bilious output, thinner than yesterday  Skin: Cool and moist.  Incisions clean dry and intact.  Psychiatric: Normal affect, non-agitated, not confused       LABS:  n/a RADS: n/a Assessment:   S/p robotic assisted laparoscopic right colectomy for unresectable 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  CT concerns for contained anastamosis leak.  Doing well on TPN.  Exam  essentially unchanged.  NG output still less than previous day, thinner in nature as well.  We will continue to monitor to see if any decrease in NG output and less bilious quality.

## 2019-03-26 NOTE — Consult Note (Signed)
PHARMACY - TOTAL PARENTERAL NUTRITION CONSULT NOTE   Indication: Prolonged ileus  Patient Measurements: Height: 5\' 2"  (157.5 cm) Weight: 193 lb 9 oz (87.8 kg) IBW/kg (Calculated) : 50.1 TPN AdjBW (KG): 57 Body mass index is 35.4 kg/m.  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 Cl:Ac ratio 1:1  Add standard MVI (twice weekly) and trace elements to TPN  continue Sensitive q4h SSI and adjust as needed   monitor TPN labs Mon/Thurs per protocol given that electrolytes continue to be stable  Dallie Piles, PharmD, BCPS 03/26/2019,8:00 AM

## 2019-03-26 NOTE — Progress Notes (Signed)
Subjective:  CC: Cassie Gaines is a 64 y.o. 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    ROS:  General: Denies weight loss, weight gain, fatigue, fevers, chills, and night sweats. Heart: Denies chest pain, palpitations, racing heart, irregular heartbeat, leg pain or swelling, and decreased activity tolerance. Respiratory: Denies breathing difficulty, shortness of breath, wheezing, cough, and sputum. GI: Denies change in appetite, constipation, diarrhea, and blood in stool. GU: Denies difficulty urinating, pain with urinating, urgency, frequency, blood in urine.   Objective:   Temp:  [97.7 F (36.5 C)-98.6 F (37 C)] 97.7 F (36.5 C) (02/18 0533) Pulse Rate:  [80-90] 84 (02/18 0839) Resp:  [16-18] 16 (02/18 0533) BP: (90-110)/(57-73) 95/61 (02/18 0839) SpO2:  [99 %-100 %] 100 % (02/18 0533) Weight:  [87.8 kg] 87.8 kg (02/18 0500)     Height: 5\' 2"  (157.5 cm) Weight: 87.8 kg BMI (Calculated): 35.39   Intake/Output this shift:   Intake/Output Summary (Last 24 hours) at 03/26/2019 1037 Last data filed at 03/26/2019 [id number] Gross per 24 hour  Intake 2157.29 ml  Output 1070 ml  Net 1087.29 ml    Constitutional :  alert, cooperative, appears stated age and no distress  Respiratory:  clear to auscultation bilaterally  Cardiovascular:  regular rate and rhythm  Gastrointestinal: still soft, no TTP, no guarding at all even with deep palpation.  thin serous output noted from JP.  incisions c/d/i.  Possibly less distention as well today. NG bilious output, no black or bloody output.  JP site has healed as well  Skin: Cool and moist.  Incisions clean dry and intact.  Psychiatric: Normal affect, non-agitated, not confused       LABS:  n/a RADS: n/a Assessment:   S/p robotic assisted laparoscopic right colectomy for unresectable 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  CT  concerns for contained anastamosis leak.  Doing well on TPN.  Decreased distention and NG output has been decreasing consistently over past couple days.  Will repeat KUB in am, along with labs, and possibly attempt clamp trial if NG output continues to decrease and bowel dilation looks improved compared to prior exams.

## 2019-03-27 ENCOUNTER — Inpatient Hospital Stay: Payer: No Typology Code available for payment source

## 2019-03-27 LAB — CBC WITH DIFFERENTIAL/PLATELET
Abs Immature Granulocytes: 0.04 10*3/uL (ref 0.00–0.07)
Basophils Absolute: 0.1 10*3/uL (ref 0.0–0.1)
Basophils Relative: 1 %
Eosinophils Absolute: 0.4 10*3/uL (ref 0.0–0.5)
Eosinophils Relative: 5 %
HCT: 36 % (ref 36.0–46.0)
Hemoglobin: 11.6 g/dL — ABNORMAL LOW (ref 12.0–15.0)
Immature Granulocytes: 1 %
Lymphocytes Relative: 24 %
Lymphs Abs: 2 10*3/uL (ref 0.7–4.0)
MCH: 30.1 pg (ref 26.0–34.0)
MCHC: 32.2 g/dL (ref 30.0–36.0)
MCV: 93.5 fL (ref 80.0–100.0)
Monocytes Absolute: 0.9 10*3/uL (ref 0.1–1.0)
Monocytes Relative: 11 %
Neutro Abs: 4.8 10*3/uL (ref 1.7–7.7)
Neutrophils Relative %: 58 %
Platelets: 407 10*3/uL — ABNORMAL HIGH (ref 150–400)
RBC: 3.85 MIL/uL — ABNORMAL LOW (ref 3.87–5.11)
RDW: 12.9 % (ref 11.5–15.5)
WBC: 8.2 10*3/uL (ref 4.0–10.5)
nRBC: 0 % (ref 0.0–0.2)

## 2019-03-27 LAB — BASIC METABOLIC PANEL
Anion gap: 10 (ref 5–15)
BUN: 45 mg/dL — ABNORMAL HIGH (ref 8–23)
CO2: 29 mmol/L (ref 22–32)
Calcium: 8.8 mg/dL — ABNORMAL LOW (ref 8.9–10.3)
Chloride: 99 mmol/L (ref 98–111)
Creatinine, Ser: 1.23 mg/dL — ABNORMAL HIGH (ref 0.44–1.00)
GFR calc Af Amer: 54 mL/min — ABNORMAL LOW (ref >60)
GFR calc non Af Amer: 47 mL/min — ABNORMAL LOW (ref >60)
Glucose, Bld: 146 mg/dL — ABNORMAL HIGH (ref 70–99)
Potassium: 3.7 mmol/L (ref 3.5–5.1)
Sodium: 138 mmol/L (ref 135–145)

## 2019-03-27 LAB — GLUCOSE, CAPILLARY
Glucose-Capillary: 116 mg/dL — ABNORMAL HIGH (ref 70–99)
Glucose-Capillary: 120 mg/dL — ABNORMAL HIGH (ref 70–99)
Glucose-Capillary: 120 mg/dL — ABNORMAL HIGH (ref 70–99)
Glucose-Capillary: 126 mg/dL — ABNORMAL HIGH (ref 70–99)
Glucose-Capillary: 130 mg/dL — ABNORMAL HIGH (ref 70–99)
Glucose-Capillary: 155 mg/dL — ABNORMAL HIGH (ref 70–99)

## 2019-03-27 LAB — PHOSPHORUS: Phosphorus: 3.9 mg/dL (ref 2.5–4.6)

## 2019-03-27 MED ORDER — TRACE MINERALS CU-MN-SE-ZN 300-55-60-3000 MCG/ML IV SOLN
83.0000 mL/h | INTRAVENOUS | Status: AC
  Filled 2019-03-27: qty 1992

## 2019-03-27 MED ORDER — FAT EMULSION PLANT BASED 20 % IV EMUL
250.0000 mL | INTRAVENOUS | Status: AC
  Administered 2019-03-27: 17:00:00 250 mL via INTRAVENOUS
  Filled 2019-03-27: qty 250

## 2019-03-27 NOTE — Consult Note (Signed)
PHARMACY - TOTAL PARENTERAL NUTRITION CONSULT NOTE   Indication: Prolonged ileus  Patient Measurements: Height: 5\' 2"  (157.5 cm) Weight: 156 lb 11.2 oz (71.1 kg) IBW/kg (Calculated) : 50.1 TPN AdjBW (KG): 57 Body mass index is 28.66 kg/m.  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 Cl:Ac ratio 1:1  Add standard MVI (twice weekly Mon, Thur) and trace elements daily to TPN  continue Sensitive q4h SSI and adjust as needed   monitor TPN labs Mon/Thurs per protocol given that electrolytes continue to be stable  Asalee Barrette A, PharmD 03/27/2019,10:03 AM

## 2019-03-27 NOTE — Progress Notes (Signed)
Subjective:  CC: Cassie Gaines is a 64 y.o. 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 Had some ice chips overnight   ROS:  General: Denies weight loss, weight gain, fatigue, fevers, chills, and night sweats. Heart: Denies chest pain, palpitations, racing heart, irregular heartbeat, leg pain or swelling, and decreased activity tolerance. Respiratory: Denies breathing difficulty, shortness of breath, wheezing, cough, and sputum. GI: Denies change in appetite, constipation, diarrhea, and blood in stool. GU: Denies difficulty urinating, pain with urinating, urgency, frequency, blood in urine.   Objective:   Temp:  [97.6 F (36.4 C)-98.3 F (36.8 C)] 97.6 F (36.4 C) (02/19 0310) Pulse Rate:  [77-81] 77 (02/19 0844) Resp:  [16-18] 18 (02/19 0310) BP: (101-121)/(59-78) 108/76 (02/19 0844) SpO2:  [98 %-100 %] 98 % (02/19 0310) Weight:  [71.1 kg] 71.1 kg (02/19 0906)     Height: 5\' 2"  (157.5 cm) Weight: 71.1 kg BMI (Calculated): 28.65   Intake/Output this shift:   Intake/Output Summary (Last 24 hours) at 03/27/2019 1012 Last data filed at 03/27/2019 0630 Gross per 24 hour  Intake 2116.25 ml  Output 1750 ml  Net 366.25 ml    Constitutional :  alert, cooperative, appears stated age and no distress  Respiratory:  clear to auscultation bilaterally  Cardiovascular:  regular rate and rhythm  Gastrointestinal: still soft, no TTP, no guarding at all even with deep palpation.  thin serous output noted from JP.  incisions c/d/i.  Possibly less distention as well today. NG very thin bilious output, no black or bloody output.  JP site has healed as well  Skin: Cool and moist.  Incisions clean dry and intact.  Psychiatric: Normal affect, non-agitated, not confused       LABS:  n/a RADS: CLINICAL DATA:  Ileus.  EXAM: ABDOMEN - 1 VIEW  COMPARISON:  CT 03/17/2019.   Abdomen 03/17/2019.  FINDINGS: NG tube noted with tip over the stomach. Side hole at the gastroesophageal junction. Surgical sutures noted over the abdomen. Previously identified small bowel distention has improved. Colonic gas pattern is normal. No free air noted. Degenerative change lumbar spine.  IMPRESSION: 1. NG tube noted with tip over the upper stomach. Side hole at the gastroesophageal junction.  2. Surgical sutures in the abdomen. Previous identified small-bowel distention has improved with mild residual small bowel distention. Colonic gas pattern is normal. No free air.   Electronically Signed   By: Marcello Moores  Register   On: 03/27/2019 06:55 Assessment:   S/p robotic assisted laparoscopic right colectomy for unresectable 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  CT concerns for contained anastamosis leak.  Doing well on TPN. NG output higher past 24hrs, but very thin with report of taking in some ice chips.  KUB shows resolved ileus and patient continues to pass flatus, BMs.    Will perform 24hr clamp trial and consider resuming diet tomorrow, NG out if possible.

## 2019-03-28 ENCOUNTER — Inpatient Hospital Stay: Payer: No Typology Code available for payment source

## 2019-03-28 LAB — PHOSPHORUS: Phosphorus: 4 mg/dL (ref 2.5–4.6)

## 2019-03-28 LAB — GLUCOSE, CAPILLARY
Glucose-Capillary: 111 mg/dL — ABNORMAL HIGH (ref 70–99)
Glucose-Capillary: 113 mg/dL — ABNORMAL HIGH (ref 70–99)
Glucose-Capillary: 123 mg/dL — ABNORMAL HIGH (ref 70–99)
Glucose-Capillary: 136 mg/dL — ABNORMAL HIGH (ref 70–99)
Glucose-Capillary: 140 mg/dL — ABNORMAL HIGH (ref 70–99)
Glucose-Capillary: 144 mg/dL — ABNORMAL HIGH (ref 70–99)

## 2019-03-28 MED ORDER — TRACE MINERALS CU-MN-SE-ZN 300-55-60-3000 MCG/ML IV SOLN
83.0000 mL/h | INTRAVENOUS | Status: AC
  Filled 2019-03-28: qty 1992

## 2019-03-28 MED ORDER — FAT EMULSION PLANT BASED 20 % IV EMUL
250.0000 mL | INTRAVENOUS | Status: AC
  Administered 2019-03-28: 18:00:00 250 mL via INTRAVENOUS
  Filled 2019-03-28: qty 250

## 2019-03-28 NOTE — Progress Notes (Signed)
Subjective:  CC: Cassie Gaines is a 64 y.o. 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  NG tube has been clamped since 8 AM yesterday.  She is tolerating this without any nausea or vomiting.  She reports having had multiple bowel movements over the past 24 hours.   ROS:  General: Denies weight loss, weight gain, fatigue, fevers, chills, and night sweats. Heart: Denies chest pain, palpitations, racing heart, irregular heartbeat, leg pain or swelling, and decreased activity tolerance. Respiratory: Denies breathing difficulty, shortness of breath, wheezing, cough, and sputum. GI: Denies change in appetite, constipation, diarrhea, and blood in stool. GU: Denies difficulty urinating, pain with urinating, urgency, frequency, blood in urine.   Objective:   Temp:  [98.4 F (36.9 C)-98.8 F (37.1 C)] 98.8 F (37.1 C) (02/20 1146) Pulse Rate:  [76-77] 76 (02/20 1146) Resp:  [16-17] 16 (02/20 1146) BP: (98-107)/(61-73) 99/64 (02/20 1146) SpO2:  [99 %-100 %] 99 % (02/20 1146) Weight:  [71.3 kg] 71.3 kg (02/20 0407)     Height: 5\' 2"  (157.5 cm) Weight: 71.3 kg BMI (Calculated): 28.74   Intake/Output this shift:   Intake/Output Summary (Last 24 hours) at 03/28/2019 1219 Last data filed at 03/28/2019 [id number] Gross per 24 hour  Intake 2318.53 ml  Output 300 ml  Net 2018.53 ml    Constitutional :  alert, cooperative, appears stated age and no distress  Respiratory:  clear to auscultation bilaterally  Cardiovascular:  regular rate and rhythm  Gastrointestinal: still soft, no TTP, no guarding at all even with deep palpation.  thin serous output noted from JP.  incisions c/d/i.  Abdomen is nondistended.. NG clamped.  JP site has healed as well  Skin: Cool and moist.  Incisions clean dry and intact.  Psychiatric: Normal affect, non-agitated, not confused       LABS:   n/a RADS: CLINICAL DATA:  Ileus.  EXAM: ABDOMEN - 1 VIEW  COMPARISON:  CT 03/17/2019.  Abdomen 03/17/2019.  FINDINGS: NG tube noted with tip over the stomach. Side hole at the gastroesophageal junction. Surgical sutures noted over the abdomen. Previously identified small bowel distention has improved. Colonic gas pattern is normal. No free air noted. Degenerative change lumbar spine.  IMPRESSION: 1. NG tube noted with tip over the upper stomach. Side hole at the gastroesophageal junction.  2. Surgical sutures in the abdomen. Previous identified small-bowel distention has improved with mild residual small bowel distention. Colonic gas pattern is normal. No free air.   Electronically Signed   By: Marcello Moores  Register   On: 03/27/2019 06:55 Assessment:   S/p robotic assisted laparoscopic right colectomy for unresectable 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  CT concerns for contained anastamosis leak.  NG tube has been clamped for over 24 hours.  The patient denies any nausea or vomiting.  Her abdomen is soft and nondistended.  She is having bowel movements.  We will check the NG tube residual this morning.  If it is less than 200 cc, we will remove the tube and initiate a clear liquid diet.  Continue TPN for now.

## 2019-03-28 NOTE — Consult Note (Signed)
PHARMACY - TOTAL PARENTERAL NUTRITION CONSULT NOTE   Indication: Prolonged ileus  Patient Measurements: Height: 5\' 2"  (157.5 cm) Weight: 157 lb 3 oz (71.3 kg) IBW/kg (Calculated) : 50.1 TPN AdjBW (KG): 57 Body mass index is 28.75 kg/m.  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 Cl:Ac ratio 1:1  Add standard MVI (twice weekly Mon, Thur) and trace elements daily to TPN  continue Sensitive q4h SSI and adjust as needed   monitor TPN labs Mon/Thurs per protocol given that electrolytes continue to be stable  Morell Mears A, PharmD 03/28/2019,11:28 AM

## 2019-03-29 LAB — GLUCOSE, CAPILLARY
Glucose-Capillary: 109 mg/dL — ABNORMAL HIGH (ref 70–99)
Glucose-Capillary: 115 mg/dL — ABNORMAL HIGH (ref 70–99)
Glucose-Capillary: 122 mg/dL — ABNORMAL HIGH (ref 70–99)
Glucose-Capillary: 130 mg/dL — ABNORMAL HIGH (ref 70–99)
Glucose-Capillary: 146 mg/dL — ABNORMAL HIGH (ref 70–99)
Glucose-Capillary: 90 mg/dL (ref 70–99)

## 2019-03-29 LAB — PHOSPHORUS: Phosphorus: 3.7 mg/dL (ref 2.5–4.6)

## 2019-03-29 MED ORDER — TRACE MINERALS CU-MN-SE-ZN 300-55-60-3000 MCG/ML IV SOLN
83.0000 mL/h | INTRAVENOUS | Status: DC
  Filled 2019-03-29: qty 1992

## 2019-03-29 MED ORDER — FAT EMULSION PLANT BASED 20 % IV EMUL
250.0000 mL | INTRAVENOUS | Status: AC
  Administered 2019-03-29: 18:00:00 250 mL via INTRAVENOUS
  Filled 2019-03-29: qty 250

## 2019-03-29 NOTE — Progress Notes (Signed)
Subjective:  CC: Cassie Gaines is a 64 y.o. female  Hospital stay day 19, 39 Days Post-Op robotic assisted laparoscopic right colectomy for unresectable dysplastic cecal polyp  HPI: No acute issues overnight.  NG tube was removed yesterday and clear liquid diet initiated.  She has tolerated this well.  She continues to have bowel movements.  Denies any nausea or vomiting.   ROS:  General: Denies weight loss, weight gain, fatigue, fevers, chills, and night sweats. Heart: Denies chest pain, palpitations, racing heart, irregular heartbeat, leg pain or swelling, and decreased activity tolerance. Respiratory: Denies breathing difficulty, shortness of breath, wheezing, cough, and sputum. GI: Denies change in appetite, constipation, diarrhea, and blood in stool. GU: Denies difficulty urinating, pain with urinating, urgency, frequency, blood in urine.   Objective:   Temp:  [98.1 F (36.7 C)-98.7 F (37.1 C)] 98.1 F (36.7 C) (02/21 0900) Pulse Rate:  [73-76] 76 (02/21 0900) Resp:  [16-17] 16 (02/21 0900) BP: (107-121)/(67-77) 118/77 (02/21 0900) SpO2:  [98 %-100 %] 100 % (02/21 0900) Weight:  [71.8 kg] 71.8 kg (02/21 0424)     Height: 5\' 2"  (157.5 cm) Weight: 71.8 kg BMI (Calculated): 28.94   Intake/Output this shift:   Intake/Output Summary (Last 24 hours) at 03/29/2019 1242 Last data filed at 03/29/2019 [id number] Gross per 24 hour  Intake 1885.32 ml  Output -  Net 1885.32 ml    Constitutional :  alert, cooperative, appears stated age and no distress  Respiratory:  clear to auscultation bilaterally  Cardiovascular:  regular rate and rhythm  Gastrointestinal: still soft, no TTP, no guarding at all even with deep palpation.  thin serous output noted from JP.  incisions c/d/i.  Abdomen is nondistended  JP site has healed as well  Skin: Cool and moist.  Incisions clean dry and intact.  Psychiatric: Normal affect, non-agitated, not confused       LABS:  n/a RADS: CLINICAL DATA:   Ileus.  EXAM: ABDOMEN - 1 VIEW  COMPARISON:  CT 03/17/2019.  Abdomen 03/17/2019.  FINDINGS: NG tube noted with tip over the stomach. Side hole at the gastroesophageal junction. Surgical sutures noted over the abdomen. Previously identified small bowel distention has improved. Colonic gas pattern is normal. No free air noted. Degenerative change lumbar spine.  IMPRESSION: 1. NG tube noted with tip over the upper stomach. Side hole at the gastroesophageal junction.  2. Surgical sutures in the abdomen. Previous identified small-bowel distention has improved with mild residual small bowel distention. Colonic gas pattern is normal. No free air.   Electronically Signed   By: Marcello Moores  Register   On: 03/27/2019 06:55 Assessment:   S/p robotic assisted laparoscopic right colectomy for unresectable 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  CT concerns for contained anastamosis leak.  She has tolerated a clear liquid diet.  We will advance to full liquids, but will continue TPN as she is not taking in a significant amount by mouth.  Anticipate that she will likely be able to discontinue parenteral nutrition in the next day or so.

## 2019-03-29 NOTE — Consult Note (Signed)
PHARMACY - TOTAL PARENTERAL NUTRITION CONSULT NOTE   Indication: Prolonged ileus  Patient Measurements: Height: 5\' 2"  (157.5 cm) Weight: 158 lb 4.6 oz (71.8 kg) IBW/kg (Calculated) : 50.1 TPN AdjBW (KG): 57 Body mass index is 28.95 kg/m.  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 trial with clear liquids multiple times  Glucose / Insulin: BG<180, 4u SSI last 24h Electrolytes: wnl (continue to be stable) Renal:  LFTs / TGs:  Prealbumin / albumin:  Intake / Output; MIVF:  GI Imaging: 2/9 Evidence of ongoing ileus, with no sign of obstruction as enteric contrast has traversed the colon and reaches the rectum Surgeries / Procedures:   Post-Op robotic assisted laparoscopic right colectomy for unresectable dysplastic cecal polyp  Central access: PICC placed 03/20/19 TPN start date: 03/20/19  Nutritional Goals (per RD recommendation on 03/20/19): Goal TPN rate is 83 mL/hr (provides 100 g of protein and 1914 kcals per day)  Current Nutrition: 2/21-She has tolerated a clear liquid diet.  We will advance to full liquids, but will continue TPN as she is not taking in a significant amount by mouth.  Anticipate that she will likely be able to discontinue parenteral nutrition in the next day or so.   Plan:   Continue E 5/15 TPN at 83 mL/hr at 1800  continue 250 mL of 20% ILE (lipids) over 12 hrs daily  Electrolytes in TPN: 71mEq/L of Na, 67mEq/L of K, 23mEq/L of Ca, 69mEq/L of Mg, and 45mmol/L of Phos. Cl:Ac ratio 1:1  Add standard MVI (twice weekly Mon, Thur) and trace elements daily to TPN  continue Sensitive q4h SSI and adjust as needed   monitor TPN labs Mon/Thurs per protocol given that electrolytes continue to be stable  Kandise Riehle A, PharmD 03/29/2019,12:51 PM

## 2019-03-29 NOTE — Plan of Care (Signed)
Patient doing well today.  Pain controlled.  Incisions C/D/I.  Ambulating well in the hallway.  Diet advanced to full liquid today, tolerating well so far.

## 2019-03-30 LAB — COMPREHENSIVE METABOLIC PANEL
ALT: 31 U/L (ref 0–44)
AST: 22 U/L (ref 15–41)
Albumin: 3 g/dL — ABNORMAL LOW (ref 3.5–5.0)
Alkaline Phosphatase: 87 U/L (ref 38–126)
Anion gap: 9 (ref 5–15)
BUN: 25 mg/dL — ABNORMAL HIGH (ref 8–23)
CO2: 24 mmol/L (ref 22–32)
Calcium: 8.9 mg/dL (ref 8.9–10.3)
Chloride: 105 mmol/L (ref 98–111)
Creatinine, Ser: 0.94 mg/dL (ref 0.44–1.00)
GFR calc Af Amer: >60 mL/min (ref >60)
GFR calc non Af Amer: >60 mL/min (ref >60)
Glucose, Bld: 142 mg/dL — ABNORMAL HIGH (ref 70–99)
Potassium: 4.3 mmol/L (ref 3.5–5.1)
Sodium: 138 mmol/L (ref 135–145)
Total Bilirubin: 0.3 mg/dL (ref 0.3–1.2)
Total Protein: 7.2 g/dL (ref 6.5–8.1)

## 2019-03-30 LAB — DIFFERENTIAL
Abs Immature Granulocytes: 0.01 10*3/uL (ref 0.00–0.07)
Basophils Absolute: 0.1 10*3/uL (ref 0.0–0.1)
Basophils Relative: 1 %
Eosinophils Absolute: 0.4 10*3/uL (ref 0.0–0.5)
Eosinophils Relative: 8 %
Immature Granulocytes: 0 %
Lymphocytes Relative: 25 %
Lymphs Abs: 1.3 10*3/uL (ref 0.7–4.0)
Monocytes Absolute: 0.7 10*3/uL (ref 0.1–1.0)
Monocytes Relative: 14 %
Neutro Abs: 2.7 10*3/uL (ref 1.7–7.7)
Neutrophils Relative %: 52 %

## 2019-03-30 LAB — GLUCOSE, CAPILLARY
Glucose-Capillary: 112 mg/dL — ABNORMAL HIGH (ref 70–99)
Glucose-Capillary: 123 mg/dL — ABNORMAL HIGH (ref 70–99)
Glucose-Capillary: 116 mg/dL — ABNORMAL HIGH (ref 70–99)
Glucose-Capillary: 111 mg/dL — ABNORMAL HIGH (ref 70–99)
Glucose-Capillary: 107 mg/dL — ABNORMAL HIGH (ref 70–99)

## 2019-03-30 LAB — CBC
HCT: 32.5 % — ABNORMAL LOW (ref 36.0–46.0)
Hemoglobin: 10.6 g/dL — ABNORMAL LOW (ref 12.0–15.0)
MCH: 30.2 pg (ref 26.0–34.0)
MCHC: 32.6 g/dL (ref 30.0–36.0)
MCV: 92.6 fL (ref 80.0–100.0)
Platelets: 307 10*3/uL (ref 150–400)
RBC: 3.51 MIL/uL — ABNORMAL LOW (ref 3.87–5.11)
RDW: 12.7 % (ref 11.5–15.5)
WBC: 5.3 10*3/uL (ref 4.0–10.5)
nRBC: 0 % (ref 0.0–0.2)

## 2019-03-30 LAB — TRIGLYCERIDES: Triglycerides: 131 mg/dL (ref <150)

## 2019-03-30 LAB — MAGNESIUM: Magnesium: 2 mg/dL (ref 1.7–2.4)

## 2019-03-30 LAB — PREALBUMIN: Prealbumin: 24.9 mg/dL (ref 18–38)

## 2019-03-30 LAB — PHOSPHORUS: Phosphorus: 4 mg/dL (ref 2.5–4.6)

## 2019-03-30 MED ORDER — TRACE MINERALS CU-MN-SE-ZN 300-55-60-3000 MCG/ML IV SOLN
INTRAVENOUS | Status: AC
  Filled 2019-03-30: qty 960

## 2019-03-30 MED ORDER — TRACE MINERALS CU-MN-SE-ZN 300-55-60-3000 MCG/ML IV SOLN
INTRAVENOUS | Status: DC
  Filled 2019-03-30: qty 960

## 2019-03-30 MED ORDER — FAT EMULSION PLANT BASED 20 % IV EMUL
250.0000 mL | INTRAVENOUS | Status: AC
  Administered 2019-03-30: 17:00:00 250 mL via INTRAVENOUS
  Filled 2019-03-30: qty 250

## 2019-03-30 MED ORDER — ENSURE ENLIVE PO LIQD
237.0000 mL | Freq: Two times a day (BID) | ORAL | Status: DC
  Administered 2019-03-30 – 2019-03-31 (×2): 237 mL via ORAL

## 2019-03-30 NOTE — Consult Note (Signed)
PHARMACY - TOTAL PARENTERAL NUTRITION CONSULT NOTE   Indication: Prolonged ileus  Patient Measurements: Height: 5\' 2"  (157.5 cm) Weight: 158 lb 11.7 oz (72 kg) IBW/kg (Calculated) : 50.1 TPN AdjBW (KG): 57 Body mass index is 29.03 kg/m.  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 trial with clear liquids multiple times  Glucose / Insulin: BG<180, 2u SSI last 24h Electrolytes: wnl (continue to be stable) Renal:  LFTs / TGs:  Prealbumin / albumin:  Intake / Output; MIVF:  GI Imaging: 2/9 Evidence of ongoing ileus, with no sign of obstruction as enteric contrast has traversed the colon and reaches the rectum Surgeries / Procedures:   Post-Op robotic assisted laparoscopic right colectomy for unresectable dysplastic cecal polyp  Central access: PICC placed 03/20/19 TPN start date: 03/20/19  Nutritional Goals (per RD recommendation on 03/20/19): Goal TPN rate is 83 mL/hr (provides 100 g of protein and 1914 kcals per day)  Current Nutrition: advanced to regular diet today   Plan:   wean E 5/15 TPN at 40 mL/hr at 1800  continue 250 mL of 20% ILE (lipids) over 12 hrs daily  Electrolytes in TPN: 23mEq/L of Na, 76mEq/L of K, 38mEq/L of Ca, 23mEq/L of Mg, and 70mmol/L of Phos. Cl:Ac ratio 1:1  Add standard MVI (twice weekly Mon, Thur) and trace elements daily to TPN  continue Sensitive q4h SSI and adjust as needed   monitor TPN labs Mon/Thurs per protocol given that electrolytes continue to be stable  Cassie Gaines, PharmD 03/30/2019,7:06 AM

## 2019-03-30 NOTE — Progress Notes (Signed)
Subjective:  CC: Cassie Gaines is a 64 y.o. female  Hospital stay day 90, 28 Days Post-Op robotic assisted laparoscopic right colectomy for unresectable dysplastic cecal polyp  HPI: No acute issues overnight. Tolerated fulls. Passing flatus, having small BMs   ROS:  General: Denies weight loss, weight gain, fatigue, fevers, chills, and night sweats. Heart: Denies chest pain, palpitations, racing heart, irregular heartbeat, leg pain or swelling, and decreased activity tolerance. Respiratory: Denies breathing difficulty, shortness of breath, wheezing, cough, and sputum. GI: Denies change in appetite, constipation, diarrhea, and blood in stool. GU: Denies difficulty urinating, pain with urinating, urgency, frequency, blood in urine.   Objective:   Temp:  [98.4 F (36.9 C)-98.5 F (36.9 C)] 98.5 F (36.9 C) (02/22 0415) Pulse Rate:  [72-79] 79 (02/22 1041) Resp:  [14-18] 18 (02/22 0415) BP: (96-120)/(60-79) 103/69 (02/22 1041) SpO2:  [99 %-100 %] 99 % (02/22 0415) Weight:  [72 kg] 72 kg (02/22 0415)     Height: 5\' 2"  (157.5 cm) Weight: 72 kg BMI (Calculated): 29.02   Intake/Output this shift:   Intake/Output Summary (Last 24 hours) at 03/30/2019 1124 Last data filed at 03/30/2019 [id number] Gross per 24 hour  Intake 2504.83 ml  Output 2050 ml  Net 454.83 ml    Constitutional :  alert, cooperative, appears stated age and no distress  Respiratory:  clear to auscultation bilaterally  Cardiovascular:  regular rate and rhythm  Gastrointestinal: still soft, no TTP, no guarding at all even with deep palpation.  incisions c/d/i.   Skin: Cool and moist.  Incisions clean dry and intact.  Psychiatric: Normal affect, non-agitated, not confused       LABS:  n/a RADS: n/a Assessment:   S/p robotic assisted laparoscopic right colectomy for unresectable 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  CT concerns for contained anastamosis leak.  Tolerating fulls.  Advance to regular. Wean TPN.  Hopefully home  tomorrow if she tolerates regular today.  Consider continuing reglan

## 2019-03-30 NOTE — Progress Notes (Signed)
   03/30/19 1425  Clinical Encounter Type  Visited With Patient  Visit Type Initial  Referral From Chaplain  Consult/Referral To Chaplain  Chaplain briefly visited with patient. When Chaplain entered the room, patient was standing at the sink, washing her hands. Chaplain asked how she was feeling and she said better. I am going home tomorrow, have been here for three weeks and am excited about tomorrow. Chaplain did not want to detain patient or keep her from finishing what she was doing so Chaplain left after telling patient she would be praying for her. Patient offered pastoral presence and empathy.

## 2019-03-30 NOTE — Progress Notes (Signed)
Nutrition Follow-up  DOCUMENTATION CODES:   Obesity unspecified  INTERVENTION:   Decrease Clinimix E 5/15 to 68m/hr and discontinue once bag complete    Ensure Enlive po BID, each supplement provides 350 kcal and 20 grams of protein  NUTRITION DIAGNOSIS:   Inadequate oral intake related to acute illness as evidenced by NPO status. Ongoing - addressing with TPN  GOAL:   Patient will meet greater than or equal to 90% of their needs -Met with TPN.  MONITOR:   PO intake, Supplement acceptance, Weight trends, Labs, Skin, I & O's, TPN  ASSESSMENT:   64y/o female with h/o HTN, GERD, hiatal herna and anemia s/p R colectomy secondary to cecal mass, now complicated by post op ileus   Pt tolerating TPN well at goal rate. Refeed labs stable. Plan is to begin to wean TPN and discontinue once bag is complete. RD will add supplements to help pt meet her estimated needs. Pt advanced to a full liquid diet yesterday; plan is for regular diet today. Per chart, pt is down 12lbs since admit; however, pt appears to be stabilizing over the past 4-5 days.   Medications reviewed and include: lovenox, insulin, reglan, protonix  Labs reviewed: K 4.3 wnl, P 4.0 wnl, Mg 2.0 wnl, BUN 25(H) Pre-albumin 24.9(L) Triglycerides 131 Hgb 10.6(L), Hct 32.5(L) cbgs- 109, 115, 122, 122, 123 x 24 hrs  Diet Order:   Diet Order            Diet regular Room service appropriate? Yes; Fluid consistency: Thin  Diet effective now             EDUCATION NEEDS:   No education needs have been identified at this time  Skin:  Skin Assessment: Skin Integrity Issues:(closed incision to abdomen)  Last BM:  2/21  Height:   Ht Readings from Last 1 Encounters:  03/11/19 5' 2"  (1.575 m)   Weight:   Wt Readings from Last 1 Encounters:  03/30/19 72 kg   Ideal Body Weight:  50 kg  BMI:  Body mass index is 29.03 kg/m.  Estimated Nutritional Needs:   Kcal:  1700-2000kcal/day  Protein:   85-100g/day  Fluid:  >1.5L/day  CKoleen DistanceMS, RD, LDN Contact information available in Amion

## 2019-03-31 LAB — GLUCOSE, CAPILLARY
Glucose-Capillary: 101 mg/dL — ABNORMAL HIGH (ref 70–99)
Glucose-Capillary: 106 mg/dL — ABNORMAL HIGH (ref 70–99)
Glucose-Capillary: 119 mg/dL — ABNORMAL HIGH (ref 70–99)
Glucose-Capillary: 88 mg/dL (ref 70–99)

## 2019-03-31 LAB — PHOSPHORUS: Phosphorus: 3.4 mg/dL (ref 2.5–4.6)

## 2019-03-31 MED ORDER — METOCLOPRAMIDE HCL 10 MG PO TABS: 10.0000 mg | ORAL_TABLET | Freq: Three times a day (TID) | ORAL | 0 refills | Status: AC

## 2019-03-31 NOTE — Progress Notes (Signed)
Discharge Summary  Patient ID: Cassie Gaines MRN: [id number] DOB/AGE: [birth year] 63 y.o.  Admit date: 03/10/2019 DX: Dysplastic colon polyp [K63.5]   Discharge date: 03/31/2019 Method of transport: POV Discharge address: [place] [address] (Home)   Discharge Exam: Blood pressure 102/70, pulse 80, temperature 98.1 F (36.7 C), temperature source Oral, resp. rate 18, height 5\' 2"  (1.575 m), weight 72.4 kg, SpO2 98 %. Picc line removed. Belongings gathered. AVS was provided to the patient which included instructions that addressed activity level, diet, discharge medications, follow-up appointments, and what to do if symptoms worsen. All questions answered for patient/caregiver clarification.   Follow-up Information     Verita Lamb, NP. Go on 04/08/2019.   Why: 2:40pm appointment Contact information: [address] [place] [address] [phone]            Signed: Lewie Chamber 03/31/2019, 4:30 PM

## 2019-03-31 NOTE — Discharge Summary (Addendum)
Physician Discharge Summary  Patient ID: Cassie Gaines MRN: [id number] DOB/AGE: 1955/08/05 64 y.o.  Admit date: 03/10/2019 Discharge date: 03/31/2019  Admission Diagnoses: Right dysplastic colon polyp  Discharge Diagnoses:  Same as above, ileus  Discharged Condition: good  Hospital Course: Underwent elective robotic assisted right hemicolectomy.  Developed prolonged ileus afterwards due to a small anastomotic leak that was contained.  Patient remained stable with minimal lab abnormalities throughout her stay despite the leak.  Due to the persistent ileus we eventually switched over to TPN and bowel rest for a full week prior to attempting a third trial of p.o. trials.  She eventually did resolve the ileus.  NG tube was discontinued and diet advance as tolerated and ready for discharge. Home on reglan for few more days just in case.  Consults: None  Discharge Exam: Blood pressure 102/70, pulse 80, temperature 98.1 F (36.7 C), temperature source Oral, resp. rate 18, height 5\' 2"  (1.575 m), weight 72.4 kg, SpO2 98 %. General appearance: alert, cooperative and no distress GI: soft, non-tender; bowel sounds normal; no masses,  no organomegaly Incisions C/D/I Disposition:  Discharge disposition: 01-Home or Self Care       Discharge Instructions    Discharge patient   Complete by: As directed    Discharge disposition: 01-Home or Self Care   Discharge patient date: 03/31/2019     Allergies as of 03/31/2019      Reactions   Erythromycin Hives   Lisinopril Cough      Medication List    STOP taking these medications   hydrOXYzine 25 MG capsule Commonly known as: Vistaril   lisinopril 40 MG tablet Commonly known as: ZESTRIL     TAKE these medications   losartan 50 MG tablet Commonly known as: COZAAR Take 50 mg by mouth 2 (two) times daily.   omeprazole 20 MG capsule Commonly known as: PRILOSEC Take 20 mg by mouth every morning. PT IS CURRENTLY OUT OF RX BUT STATES THAT  SHE MAY HAVE IT REFILLED BY SURGERY DATE         Total time spent arranging discharge was >7min. Signed: Benjamine Sprague 03/31/2019, 8:05 AM

## 2019-06-06 ENCOUNTER — Ambulatory Visit: Payer: 59 | Attending: Internal Medicine

## 2019-06-06 DIAGNOSIS — Z23 Encounter for immunization: Secondary | ICD-10-CM

## 2019-06-06 NOTE — Progress Notes (Signed)
   Covid-19 Vaccination Clinic  Name:  Cassie Gaines    MRN: [id number] DOB: [birth year]  06/06/2019  Ms. Grounds was observed post Covid-19 immunization for 15 minutes without incident. She was provided with Vaccine Information Sheet and instruction to access the V-Safe system.   [name] was instructed to call 911 with any severe reactions post vaccine: Marland Kitchen Difficulty breathing  . Swelling of face and throat  . A fast heartbeat  . A bad rash all over body  . Dizziness and weakness   Immunizations Administered    Name Date Dose VIS Date Route   Pfizer COVID-19 Vaccine 06/06/2019 10:59 AM 0.3 mL 04/01/2018 Intramuscular   Manufacturer: [place]   Lot: [id number]   [place]: [id number]

## 2019-06-30 ENCOUNTER — Ambulatory Visit: Payer: 59 | Attending: Internal Medicine

## 2019-06-30 DIAGNOSIS — Z23 Encounter for immunization: Secondary | ICD-10-CM

## 2019-06-30 NOTE — Progress Notes (Signed)
° °  Covid-19 Vaccination Clinic  Name:  Cassie Gaines    MRN: [id number] DOB: 08-13-1955  06/30/2019  [name] was observed post Covid-19 immunization for 15 minutes without incident. She was provided with Vaccine Information Sheet and instruction to access the V-Safe system.   [name] was instructed to call 911 with any severe reactions post vaccine:  Difficulty breathing   Swelling of face and throat   A fast heartbeat   A bad rash all over body   Dizziness and weakness   Immunizations Administered    Name Date Dose VIS Date Route   Pfizer COVID-19 Vaccine 06/30/2019  8:37 AM 0.3 mL 04/01/2018 Intramuscular   Manufacturer: [place]   Lot: [id number]   [place]: [id number]

## 2020-05-13 ENCOUNTER — Telehealth: Payer: Self-pay

## 2020-05-13 NOTE — Telephone Encounter (Signed)
[facility] referring for Vaginal Spotting, history of uterine fibroids. Paper records. Called and left voicemail for patient to call back to be scheduled.

## 2020-05-16 NOTE — Telephone Encounter (Signed)
Called and left voicemail for patient to call back to be scheduled. 

## 2020-05-17 NOTE — Telephone Encounter (Signed)
Voicemail not set up unable to leave message

## 2020-12-24 IMAGING — CT CT ABD-PELV W/ CM
2 of 5 series · 15 of 46 positions shown, 17 images · IV contrast (APPLIED)
Comparison: 03/15/2019

CLINICAL DATA: 63-year-old female with a history right colectomy,
and known fluid and gas collection at the anastomosis, concern for
leak

EXAM:
CT ABDOMEN AND PELVIS WITH CONTRAST
TECHNIQUE: Multidetector CT imaging of the abdomen and pelvis was performed
using the standard protocol following bolus administration of
intravenous contrast.
CONTRAST:  100mL OMNIPAQUE IOHEXOL 300 MG/ML  SOLN

[Series 2: axial st · axial · 0.87mm/px · z∈[-787,-392]mm · 12 of 93 slices shown, 14 images]
[im 7/93  soft-tissue]
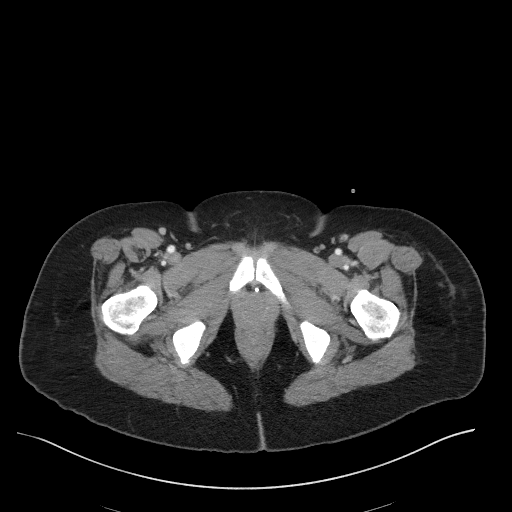
[im 7/93  bone]
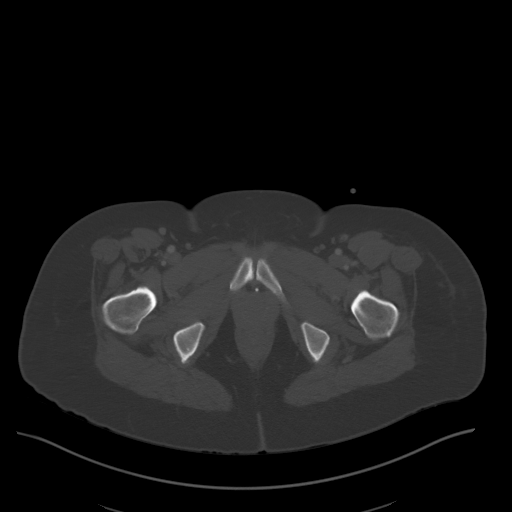
[im 13/93  soft-tissue]
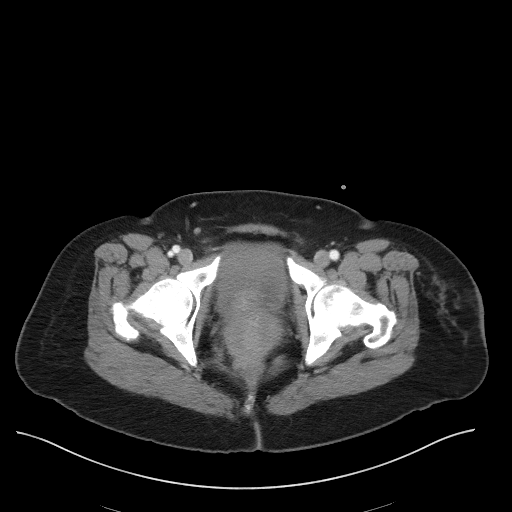
[im 19/93  soft-tissue]
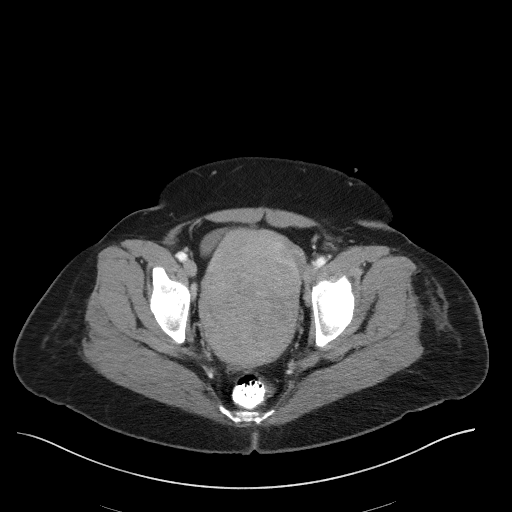
[im 31/93  soft-tissue]
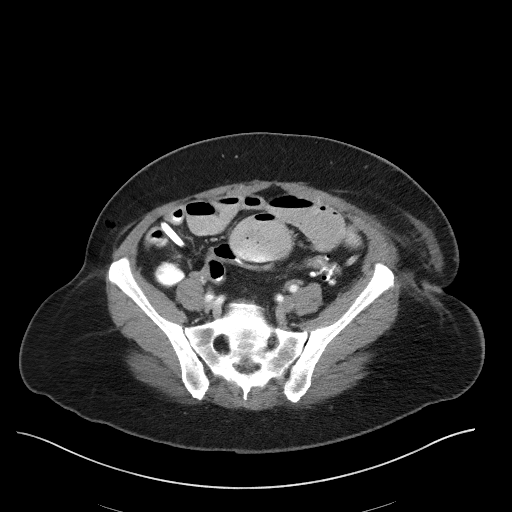
[im 37/93  soft-tissue]
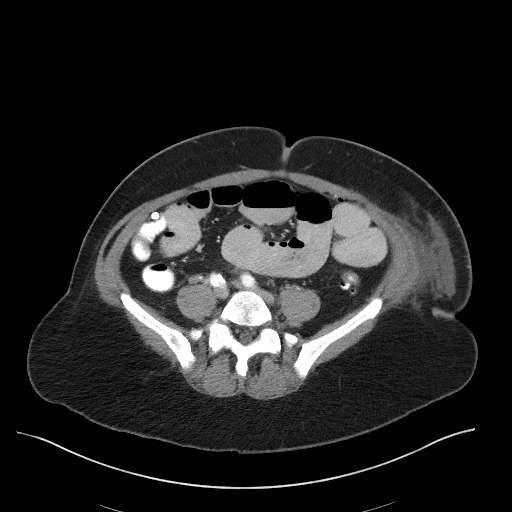
[im 43/93  soft-tissue]
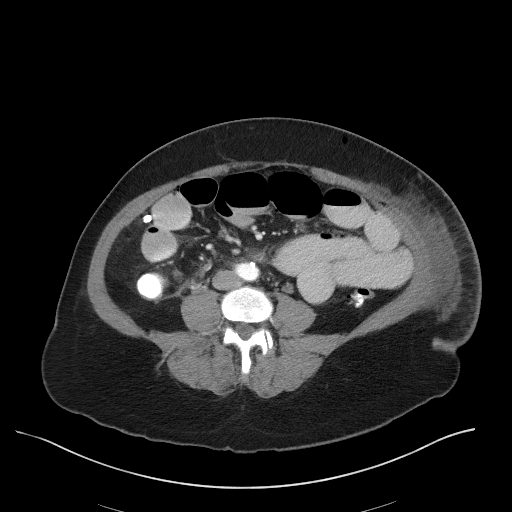
[im 50/93  soft-tissue]
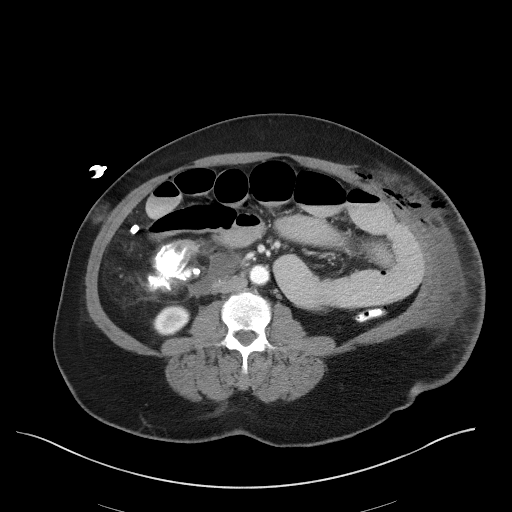
[im 56/93  soft-tissue]
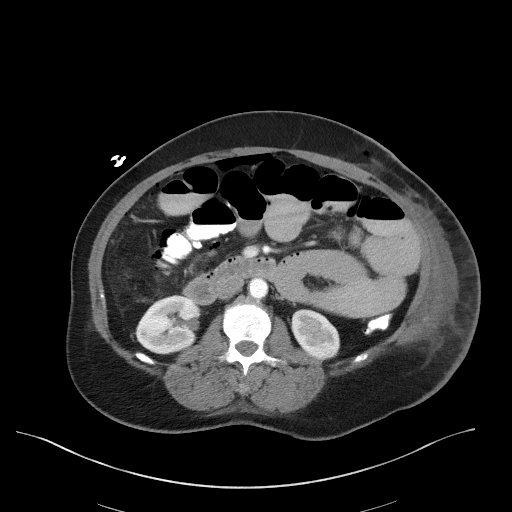
[im 62/93  soft-tissue]
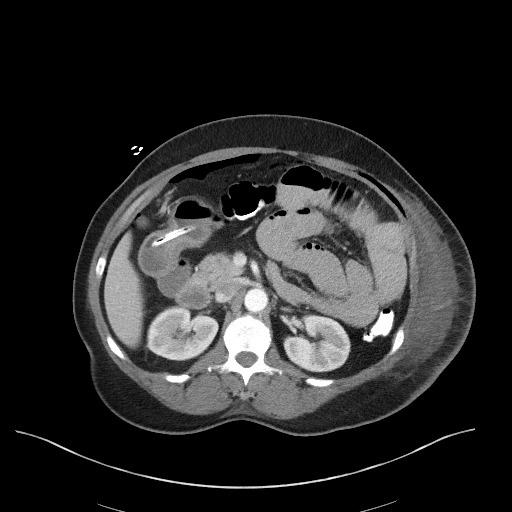
[im 62/93  bone]
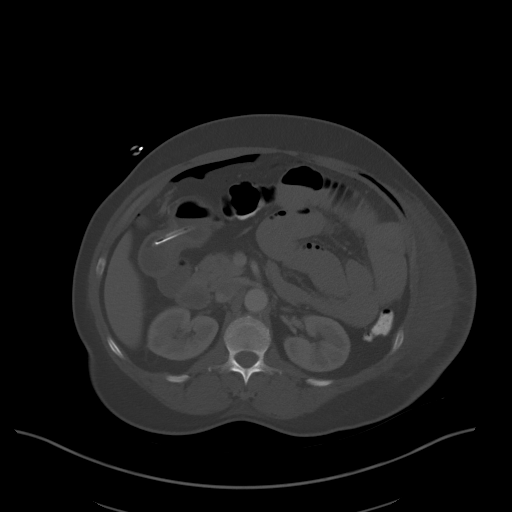
[im 74/93  soft-tissue]
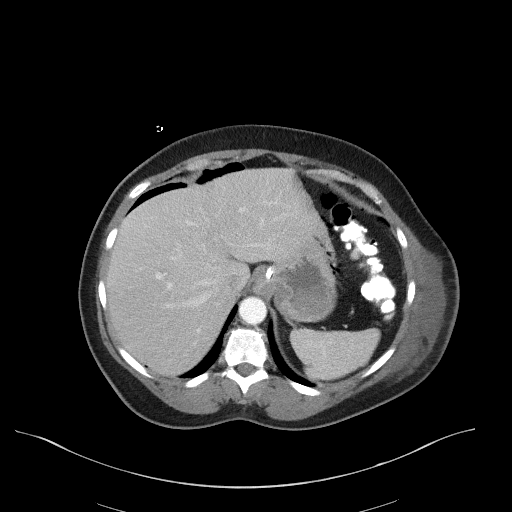
[im 80/93  soft-tissue]
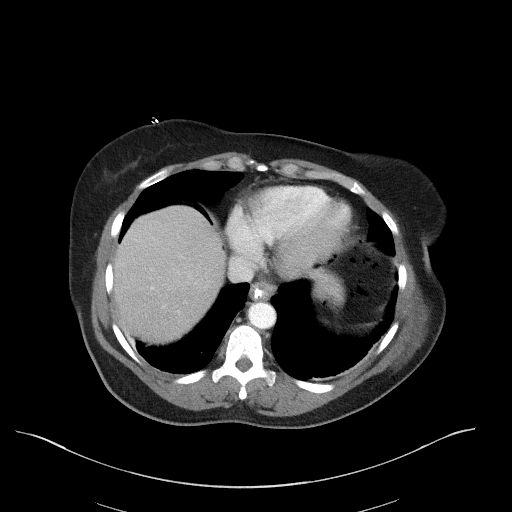
[im 86/93  soft-tissue]
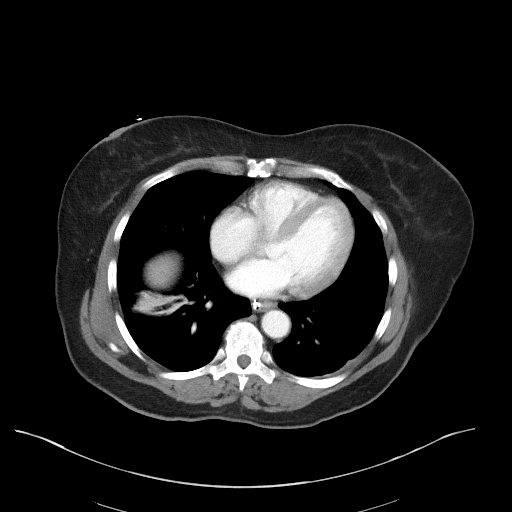

[Series 5: coronal st · coronal · 0.81mm/px · 3 of 95 slices shown]
[im 32/95  soft-tissue]
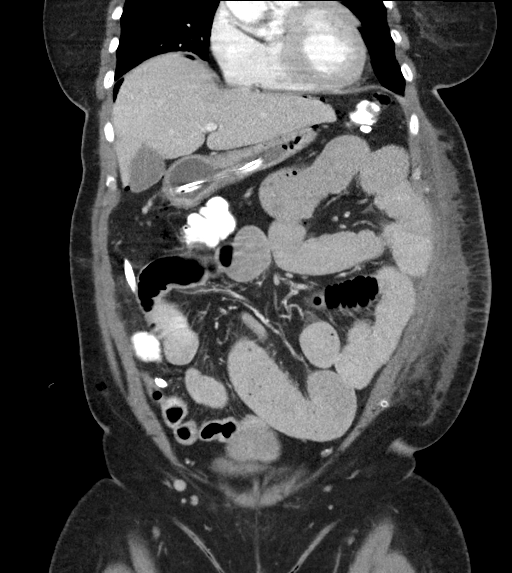
[im 42/95  soft-tissue]
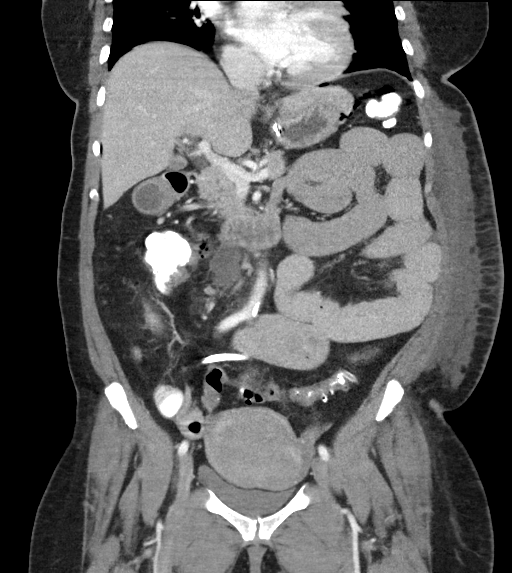
[im 53/95  soft-tissue]
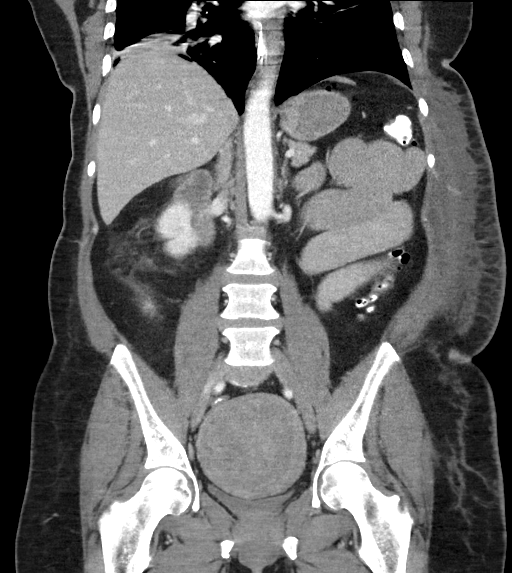

[15 of 46 positions shown; findings below may reference images not displayed]

FINDINGS: Lower chest: Atelectasis/scarring at the lung bases.

Hepatobiliary: Unremarkable liver.  Unremarkable gallbladder.

Pancreas: Unremarkable pancreas

Spleen: Unremarkable

Adrenals/Urinary Tract:

- Right adrenal gland:  Unremarkable

- Left adrenal gland: Unremarkable.

- Right kidney: No hydronephrosis, nephrolithiasis, inflammation, or
ureteral dilation. No focal lesion.

- Left Kidney: No hydronephrosis, nephrolithiasis, inflammation, or
ureteral dilation. No focal lesion.

- Urinary Bladder: Urinary bladder relatively decompressed.

Stomach/Bowel:

- Stomach: Gastric tube terminates within the stomach lumen. Stomach
is relatively decompressed.

- Small bowel: Enteric contrast has traversed the small bowel, and
reaches the anastomosis. Contrast has extended through the colon to
the rectum.

Surgical changes of right partial colectomy and anastomosis. The
coronal reformatted images demonstrate a linear hyperdensity
extending from the medial margin of the anastomosis, in a crescent
configuration, from the series imaging sequence 36-40 of series 5.
This is at the site of persisting fluid and gas collection. This
crescentic hyperdensity does not appear to represent a suture line.

Persisting dilated small bowel loops.

- Colon: Colon is relatively decompressed. Contrast reaches distal
colon. Diverticular disease.

Surgical drain remains in place from a left lower abdominal
approach, terminating in the right abdomen.

Persisting free air within the abdomen, which overall appears
decreased in volume compared to the prior CT. Overall the fluid and
gas collection has not increased significantly in size, with the
largest diameter 48 mm, essentially the same as the prior 5 cm.

Vascular/Lymphatic: No significant atherosclerosis. Unremarkable
venous structures.

No adenopathy.

Reproductive: Fibroid uterus.  Unremarkable adnexa.

Other: Surgical changes of the left abdominal wall, similar to the
prior.

Musculoskeletal: No displaced fracture.  No bony canal narrowing.
IMPRESSION: The CT confirms a small anastomotic leak at the site of partial
right colectomy, however, the adjacent fluid and gas collection,
presumably phlegmon/abscess, has not increased in size from the
comparison CT, and perhaps is overall slightly smaller. Similar to
slightly decreased volume of free intraperitoneal air within the
abdomen.

Evidence of ongoing ileus, with no sign of obstruction as enteric
contrast has traversed the colon and reaches the rectum.

Additional ancillary findings as above.

## 2021-09-04 ENCOUNTER — Other Ambulatory Visit: Payer: Self-pay | Admitting: Family

## 2021-09-04 DIAGNOSIS — Z1231 Encounter for screening mammogram for malignant neoplasm of breast: Secondary | ICD-10-CM

## 2021-09-26 ENCOUNTER — Ambulatory Visit
Admission: RE | Admit: 2021-09-26 | Discharge: 2021-09-26 | Disposition: A | Discharge status: Home / Self Care | Payer: No Typology Code available for payment source | Source: Ambulatory Visit | Attending: Family | Admitting: Family

## 2021-09-26 DIAGNOSIS — Z1231 Encounter for screening mammogram for malignant neoplasm of breast: Secondary | ICD-10-CM | POA: Insufficient documentation

## 2022-10-29 ENCOUNTER — Other Ambulatory Visit: Payer: Self-pay | Admitting: Family

## 2022-10-29 DIAGNOSIS — Z1231 Encounter for screening mammogram for malignant neoplasm of breast: Secondary | ICD-10-CM

## 2022-11-19 ENCOUNTER — Ambulatory Visit
Admission: RE | Admit: 2022-11-19 | Discharge: 2022-11-19 | Disposition: A | Discharge status: Home / Self Care | Payer: No Typology Code available for payment source | Source: Ambulatory Visit | Attending: Family | Admitting: Family

## 2022-11-19 DIAGNOSIS — Z1231 Encounter for screening mammogram for malignant neoplasm of breast: Secondary | ICD-10-CM | POA: Diagnosis present

## 2023-10-21 ENCOUNTER — Other Ambulatory Visit: Payer: Self-pay | Admitting: Family

## 2023-10-21 DIAGNOSIS — Z1231 Encounter for screening mammogram for malignant neoplasm of breast: Secondary | ICD-10-CM

## 2023-10-28 ENCOUNTER — Ambulatory Visit

## 2023-10-28 DIAGNOSIS — Z860100 Personal history of colon polyps, unspecified: Secondary | ICD-10-CM | POA: Diagnosis not present

## 2023-10-28 DIAGNOSIS — D124 Benign neoplasm of descending colon: Secondary | ICD-10-CM | POA: Diagnosis not present

## 2023-10-28 DIAGNOSIS — K635 Polyp of colon: Secondary | ICD-10-CM | POA: Diagnosis not present

## 2023-10-28 DIAGNOSIS — K640 First degree hemorrhoids: Secondary | ICD-10-CM | POA: Diagnosis not present

## 2023-10-28 DIAGNOSIS — Z09 Encounter for follow-up examination after completed treatment for conditions other than malignant neoplasm: Secondary | ICD-10-CM | POA: Diagnosis present

## 2023-10-28 DIAGNOSIS — K5730 Diverticulosis of large intestine without perforation or abscess without bleeding: Secondary | ICD-10-CM | POA: Diagnosis not present

## 2023-11-20 ENCOUNTER — Ambulatory Visit
Admission: RE | Admit: 2023-11-20 | Discharge: 2023-11-20 | Disposition: A | Discharge status: Home / Self Care | Source: Ambulatory Visit | Attending: Family | Admitting: Family

## 2023-11-20 DIAGNOSIS — Z1231 Encounter for screening mammogram for malignant neoplasm of breast: Secondary | ICD-10-CM | POA: Diagnosis present
